# Patient Record
Sex: Female | Born: 1950
Health system: Southern US, Community
[De-identification: ages and names within clinical notes are randomized; demographics above are authoritative.]

## PROBLEM LIST (undated history)

## (undated) DIAGNOSIS — H409 Unspecified glaucoma: Secondary | ICD-10-CM

## (undated) DIAGNOSIS — I10 Essential (primary) hypertension: Secondary | ICD-10-CM

## (undated) DIAGNOSIS — E785 Hyperlipidemia, unspecified: Secondary | ICD-10-CM

## (undated) DIAGNOSIS — H269 Unspecified cataract: Secondary | ICD-10-CM

## (undated) DIAGNOSIS — E039 Hypothyroidism, unspecified: Secondary | ICD-10-CM

## (undated) DIAGNOSIS — U071 COVID-19: Secondary | ICD-10-CM

## (undated) DIAGNOSIS — K219 Gastro-esophageal reflux disease without esophagitis: Secondary | ICD-10-CM

## (undated) DIAGNOSIS — E079 Disorder of thyroid, unspecified: Secondary | ICD-10-CM

## (undated) DIAGNOSIS — M1990 Unspecified osteoarthritis, unspecified site: Secondary | ICD-10-CM

## (undated) HISTORY — DX: Essential (primary) hypertension: I10

## (undated) HISTORY — DX: Hypothyroidism, unspecified: E03.9

## (undated) HISTORY — DX: COVID-19: U07.1

## (undated) HISTORY — DX: Unspecified osteoarthritis, unspecified site: M19.90

## (undated) HISTORY — DX: Unspecified cataract: H26.9

## (undated) HISTORY — DX: Hyperlipidemia, unspecified: E78.5

## (undated) HISTORY — DX: Gastro-esophageal reflux disease without esophagitis: K21.9

## (undated) HISTORY — DX: Disorder of thyroid, unspecified: E07.9

## (undated) HISTORY — DX: Unspecified glaucoma: H40.9

---

## 2000-06-07 ENCOUNTER — Other Ambulatory Visit: Admission: RE | Admit: 2000-06-07 | Discharge: 2000-06-07 | Payer: Self-pay | Admitting: Obstetrics and Gynecology

## 2001-06-22 ENCOUNTER — Other Ambulatory Visit: Admission: RE | Admit: 2001-06-22 | Discharge: 2001-06-22 | Payer: Self-pay | Admitting: Obstetrics and Gynecology

## 2002-06-19 ENCOUNTER — Other Ambulatory Visit: Admission: RE | Admit: 2002-06-19 | Discharge: 2002-06-19 | Payer: Self-pay | Admitting: Obstetrics and Gynecology

## 2003-06-06 ENCOUNTER — Other Ambulatory Visit: Admission: RE | Admit: 2003-06-06 | Discharge: 2003-06-06 | Payer: Self-pay | Admitting: Obstetrics and Gynecology

## 2004-06-17 ENCOUNTER — Other Ambulatory Visit: Admission: RE | Admit: 2004-06-17 | Discharge: 2004-06-17 | Payer: Self-pay | Admitting: Obstetrics and Gynecology

## 2005-09-02 ENCOUNTER — Other Ambulatory Visit: Admission: RE | Admit: 2005-09-02 | Discharge: 2005-09-02 | Payer: Self-pay | Admitting: Obstetrics and Gynecology

## 2008-01-13 ENCOUNTER — Encounter (INDEPENDENT_AMBULATORY_CARE_PROVIDER_SITE_OTHER): Payer: Self-pay | Admitting: Gastroenterology

## 2008-01-13 ENCOUNTER — Ambulatory Visit (HOSPITAL_COMMUNITY): Admission: RE | Admit: 2008-01-13 | Discharge: 2008-01-13 | Payer: Self-pay | Admitting: Gastroenterology

## 2008-12-12 ENCOUNTER — Encounter: Payer: Self-pay | Admitting: Family Medicine

## 2008-12-12 ENCOUNTER — Encounter (INDEPENDENT_AMBULATORY_CARE_PROVIDER_SITE_OTHER): Payer: Self-pay | Admitting: *Deleted

## 2008-12-12 LAB — CONVERTED CEMR LAB
ALT: 22 units/L
AST: 14 units/L
Albumin: 4.2 g/dL
Alkaline Phosphatase: 57 units/L
Chloride, Serum: 103 mmol/L
LDL Cholesterol: 145 mg/dL
Potassium, serum: 3.7 mmol/L
Sodium, serum: 141 mmol/L
Total Bilirubin: 0.5 mg/dL
Total Protein: 7.4 g/dL
Vit D, 25-Hydroxy: 17 ng/mL

## 2009-01-25 ENCOUNTER — Ambulatory Visit: Payer: Self-pay | Admitting: Family Medicine

## 2009-01-25 DIAGNOSIS — E039 Hypothyroidism, unspecified: Secondary | ICD-10-CM

## 2009-01-25 DIAGNOSIS — I10 Essential (primary) hypertension: Secondary | ICD-10-CM | POA: Insufficient documentation

## 2009-01-25 DIAGNOSIS — E559 Vitamin D deficiency, unspecified: Secondary | ICD-10-CM

## 2009-01-25 DIAGNOSIS — H409 Unspecified glaucoma: Secondary | ICD-10-CM | POA: Insufficient documentation

## 2009-01-30 ENCOUNTER — Encounter (INDEPENDENT_AMBULATORY_CARE_PROVIDER_SITE_OTHER): Payer: Self-pay | Admitting: *Deleted

## 2009-02-01 ENCOUNTER — Ambulatory Visit: Payer: Self-pay | Admitting: Family Medicine

## 2009-02-01 DIAGNOSIS — M549 Dorsalgia, unspecified: Secondary | ICD-10-CM | POA: Insufficient documentation

## 2009-02-22 ENCOUNTER — Ambulatory Visit: Payer: Self-pay | Admitting: Family Medicine

## 2009-02-25 ENCOUNTER — Encounter (INDEPENDENT_AMBULATORY_CARE_PROVIDER_SITE_OTHER): Payer: Self-pay | Admitting: *Deleted

## 2009-02-25 LAB — CONVERTED CEMR LAB
CO2: 34 meq/L — ABNORMAL HIGH (ref 19–32)
Calcium: 9.2 mg/dL (ref 8.4–10.5)
Glucose, Bld: 85 mg/dL (ref 70–99)
Potassium: 3.6 meq/L (ref 3.5–5.1)
Sodium: 143 meq/L (ref 135–145)

## 2009-03-22 ENCOUNTER — Ambulatory Visit: Payer: Self-pay | Admitting: Family Medicine

## 2009-06-21 ENCOUNTER — Ambulatory Visit: Payer: Self-pay | Admitting: Family Medicine

## 2009-07-24 ENCOUNTER — Ambulatory Visit: Payer: Self-pay | Admitting: Family Medicine

## 2010-04-23 ENCOUNTER — Telehealth (INDEPENDENT_AMBULATORY_CARE_PROVIDER_SITE_OTHER): Payer: Self-pay | Admitting: *Deleted

## 2011-01-09 ENCOUNTER — Telehealth (INDEPENDENT_AMBULATORY_CARE_PROVIDER_SITE_OTHER): Payer: Self-pay | Admitting: *Deleted

## 2011-01-13 ENCOUNTER — Encounter (INDEPENDENT_AMBULATORY_CARE_PROVIDER_SITE_OTHER): Payer: Self-pay | Admitting: *Deleted

## 2011-01-13 ENCOUNTER — Encounter: Payer: Self-pay | Admitting: Family Medicine

## 2011-01-13 LAB — CONVERTED CEMR LAB
ALT: 27 units/L
AST: 20 units/L
Albumin: 4.3 g/dL
Alkaline Phosphatase: 55 units/L
BUN: 14 mg/dL
Calcium: 9.6 mg/dL
Chloride, Serum: 99 mmol/L
Cholesterol: 213 mg/dL
Free T4: 1.05 ng/dL
Potassium, serum: 3.2 mmol/L
Sodium, serum: 140 mmol/L
Vit D, 25-Hydroxy: 41 ng/mL

## 2011-01-13 NOTE — Progress Notes (Signed)
Summary: refill  Phone Note Refill Request Message from:  Fax from Pharmacy on Apr 23, 2010 4:57 PM  Refills Requested: Medication #1:  AMLODIPINE BESYLATE 5 MG  TABS 1 by mouth every day. fax from [facility] - [phone]    Method Requested: Fax to Mail Away Pharmacy Next Appointment Scheduled: none Initial call taken by: Okey Regal [place],  Apr 23, 2010 4:57 PM  Follow-up for Phone Call        rx faxed to [phone] .Kandice Hams  Apr 24, 2010 9:00 AM  Follow-up by: Kandice Hams,  Apr 24, 2010 9:00 AM    Prescriptions: AMLODIPINE BESYLATE 5 MG  TABS (AMLODIPINE BESYLATE) 1 by mouth every day  #90 x 1   Entered by:   Kandice Hams   Authorized by:   Neena Rhymes MD   Signed by:   Kandice Hams on 04/24/2010   Method used:   Printed then faxed to ...       [facility] (mail-order)       [address] [place], [place]  [address]       Ph: [phone]       Fax: [phone]   RxID:   [id number]

## 2011-01-19 ENCOUNTER — Encounter: Payer: Self-pay | Admitting: Family Medicine

## 2011-01-19 LAB — CONVERTED CEMR LAB: Pap Smear: NORMAL

## 2011-01-20 ENCOUNTER — Encounter (INDEPENDENT_AMBULATORY_CARE_PROVIDER_SITE_OTHER): Payer: Self-pay | Admitting: *Deleted

## 2011-01-20 ENCOUNTER — Telehealth (INDEPENDENT_AMBULATORY_CARE_PROVIDER_SITE_OTHER): Payer: Self-pay | Admitting: *Deleted

## 2011-01-21 NOTE — Progress Notes (Signed)
Summary: --please schedule--pt has questions  Phone Note Refill Request Message from:  Fax from Pharmacy on January 09, 2011 8:42 AM  Refills Requested: Medication #1:  AMLODIPINE BESYLATE 5 MG  TABS 1 by mouth every day. Jocelyn Lamer - fax [phone]  Initial call taken by: Okey Regal Spring,  January 09, 2011 8:43 AM  Follow-up for Phone Call        please call and schedule appt patient hasn't been seen since 07/24/2009 will not refill meds until office visit schedule and will only do 30 day supply ....Marland KitchenMarland KitchenDoristine Devoid CMA  January 09, 2011 1:44 PM   Additional Follow-up for Phone Call Additional follow up Details #1::        lmtcb Additional Follow-up by: Lavell Islam,  January 09, 2011 2:02 PM    Additional Follow-up for Phone Call Additional follow up Details #2::    Patient says she saw Dr Milton Ferguson the day after [facility] left message, so Dr Milton Ferguson refilled her prescription--  I suggested that she might want to schedule a physical since it had been over a year, but she said that Dr Milton Ferguson did a complete physical on that day too, so patient feels like she doesnt need to see Dr Beverely Low for a physical   Follow-up by: Jerolyn Shin,  January 15, 2011 10:33 AM  Additional Follow-up for Phone Call Additional follow up Details #3:: Details for Additional Follow-up Action Taken: Patient called back and asked if Dr Beverely Low could call her back to explain when she should be seen by Dr Beverely Low since Dr Milton Ferguson sees her for ob/gyn problems and checkup as well as blood pressure, etc--please call her at [phone]---I asked if the nurse could call her back and she said that would be OK Additional Follow-up by: Jerolyn Shin,  January 15, 2011 10:47 AM  Pt established here and indicated that her BP had never really been controlled.  we switched her meds and got BP better.  people w/ HTN should f/u every 6 months- once to check BP, once for CPE.  GYN will do pap and breast exam but rarely do they check  eyes, ears, nose, throat, heart, lungs, skin, etc.  We have plenty of pt's that see their GYN once yearly but also have a physical and do regular f/u's w/ me for their medical issues (including BP).  If Dr Vincente Poli is comfortable coordinating all of pt's care then she does not see me but this is usually not the case.  Neena Rhymes MD  January 15, 2011 11:10 AM.   Left message to call office .Felecia Deloach CMA  January 15, 2011 12:35 PM   Discuss with patient.Felecia Deloach CMA  January 16, 2011 10:40 AM

## 2011-01-29 NOTE — Miscellaneous (Signed)
Summary: labs from gyn  Clinical Lists Changes  Observations: Added new observation of VIT D 25-OH: 41 ng/mL (01/13/2011 13:35) Added new observation of FT4 INDEX: 3.1  (01/13/2011 13:35) Added new observation of TSH: 3.3.08 microintl units/mL (01/13/2011 13:35) Added new observation of T4, FREE: 1.05 ng/dL (45/40/9811 91:47) Added new observation of TRIGLYC TOT: 73 mg/dL (82/95/6213 08:65) Added new observation of LDL: 134 mg/dL (78/46/9629 52:84) Added new observation of HDL: 64 mg/dL (13/24/4010 27:25) Added new observation of CHOLESTEROL: 213 mg/dL (36/64/4034 74:25) Added new observation of BILI TOTAL: 0.6 mg/dL (95/63/8756 43:32) Added new observation of ALK PHOS: 55 units/L (01/13/2011 13:35) Added new observation of SGPT (ALT): 27 units/L (01/13/2011 13:35) Added new observation of SGOT (AST): 20 units/L (01/13/2011 13:35) Added new observation of PROTEIN, TOT: 7.2 g/dL (95/18/8416 60:63) Added new observation of ALBUMIN: 4.3 g/dL (01/60/1093 23:55) Added new observation of CALCIUM: 9.6 mg/dL (73/22/0254 27:06) Added new observation of HGBA1C: 6.2 % (01/13/2011 13:35) Added new observation of GLUCOSE SER: 87 mg/dL (23/76/2831 51:76) Added new observation of CREATININE: 1.01 mg/dL (16/06/3709 62:69) Added new observation of BUN: 14 mg/dL (48/54/6270 35:00) Added new observation of CO2 TOTAL: 26 mmol/L (01/13/2011 13:35) Added new observation of CHLORIDE: 99 mmol/L (01/13/2011 13:35) Added new observation of POTASSIUM: 3.2 mmol/L (01/13/2011 13:35) Added new observation of SODIUM: 140 mmol/L (01/13/2011 13:35)  Appended Document: labs from gyn pt's LDL is elevated.  should have OV to discuss results and possibly starting medication (which is what i would recommend)  Appended Document: labs from gyn see phone note

## 2011-01-29 NOTE — Progress Notes (Signed)
Summary: labs  Phone Note Outgoing Call   Call placed by: Doristine Devoid CMA,  January 20, 2011 3:04 PM Call placed to: Patient Summary of Call: pt's LDL is elevated.  should have OV to discuss results and possibly starting medication (which is what i would recommend)  Follow-up for Phone Call        left message on machine .......Marland KitchenDoristine Devoid CMA  January 20, 2011 3:04 PM   spoke w/ patient aware of information office visit scheduled.....Marland KitchenMarland KitchenDoristine Devoid CMA  January 23, 2011 3:48 PM

## 2011-02-02 ENCOUNTER — Ambulatory Visit (INDEPENDENT_AMBULATORY_CARE_PROVIDER_SITE_OTHER): Payer: Managed Care, Other (non HMO) | Admitting: Family Medicine

## 2011-02-02 ENCOUNTER — Encounter: Payer: Self-pay | Admitting: Family Medicine

## 2011-02-02 DIAGNOSIS — E785 Hyperlipidemia, unspecified: Secondary | ICD-10-CM | POA: Insufficient documentation

## 2011-02-02 DIAGNOSIS — E119 Type 2 diabetes mellitus without complications: Secondary | ICD-10-CM | POA: Insufficient documentation

## 2011-02-10 NOTE — Assessment & Plan Note (Signed)
Summary: Discuss labs from gyn/cdj   Vital Signs:  Patient profile:   60 year old female Height:      62.50 inches Weight:      227 pounds BMI:     41.00 Pulse rate:   70 / minute BP sitting:   132 / 80  (left arm)  Vitals Entered By: Doristine Devoid CMA (February 02, 2011 10:57 AM) CC: f/u on labs    History of Present Illness: 60 yo woman here today to discuss labs from GYN.  recently started walking- has lost 3 lbs since GYN visit.  reviewed that A1C is elevated which would put her in the diabetic range.  has appt upcoming w/ endo.  discussed cholesterol and starting statin.  Current Medications (verified): 1)  Synthroid 50 Mcg Tabs (Levothyroxine Sodium) .... Take One Tablet Daily 2)  Travatan Z 0.004 % Soln (Travoprost) .... Use Daily 3)  Fluticasone Propionate 50 Mcg/act  Susp (Fluticasone Propionate) .... 2 Sprays Each Nostril Once Daily 4)  Omeprazole 20 Mg Tbec (Omeprazole) .... Take 1 Tab Once Daily 5)  Lisinopril-Hydrochlorothiazide 20-25 Mg  Tabs (Lisinopril-Hydrochlorothiazide) .... Take 1 Tab By Mouth Daily 6)  Aspirin 81 Mg Tbec (Aspirin) .... Take One Tablet Daily 7)  Amlodipine Besylate 5 Mg  Tabs (Amlodipine Besylate) .Marland Kitchen.. 1 By Mouth Every Day 8)  Vitamin D3 2000 Unit Caps (Cholecalciferol) .... Take One Tablet Daily 9)  Calcium 1500 Mg Tabs (Calcium Carbonate) .... Take One Tablet Daily 10)  Ra Biotin 2500 Mcg Caps (Biotin) .... Take One Tablet Daily  Allergies (verified): No Known Drug Allergies  Past History:  Past Medical History: Hypothyroidism Hypertension Glaucoma Vitamin D Deficiency DM hyperlipidemia  Review of Systems      See HPI  Physical Exam  General:  Obese, well-developed,well-nourished,in no acute distress; alert,appropriate and cooperative throughout examination Psych:  Cognition and judgment appear intact. Alert and cooperative with normal attention span and concentration. No apparent delusions, illusions,  hallucinations   Impression & Recommendations:  Problem # 1:  DIABETES MELLITUS TYPE 2 (ICD-250.00) Assessment New pt's A1C at GYN is elevated into the diabetic range.  at this stage does not require meds.  has appt w/ Endo scheduled. Her updated medication list for this problem includes:    Lisinopril-hydrochlorothiazide 20-25 Mg Tabs (Lisinopril-hydrochlorothiazide) .Marland Kitchen... Take 1 tab by mouth daily    Aspirin 81 Mg Tbec (Aspirin) .Marland Kitchen... Take one tablet daily  Problem # 2:  HYPERLIPIDEMIA (ICD-272.4) Assessment: New discussed that goal LDL w/ new dx of DM is <70.  pt wants to attempt to control w/ diet and exercise.  will follow and repeat labs in July.  if still elevated will start statin.  Complete Medication List: 1)  Synthroid 50 Mcg Tabs (Levothyroxine sodium) .... Take one tablet daily 2)  Travatan Z 0.004 % Soln (Travoprost) .... Use daily 3)  Fluticasone Propionate 50 Mcg/act Susp (Fluticasone propionate) .... 2 sprays each nostril once daily 4)  Omeprazole 20 Mg Tbec (Omeprazole) .... Take 1 tab once daily 5)  Lisinopril-hydrochlorothiazide 20-25 Mg Tabs (Lisinopril-hydrochlorothiazide) .... Take 1 tab by mouth daily 6)  Aspirin 81 Mg Tbec (Aspirin) .... Take one tablet daily 7)  Amlodipine Besylate 5 Mg Tabs (Amlodipine besylate) .Marland Kitchen.. 1 by mouth every day 8)  Vitamin D3 2000 Unit Caps (Cholecalciferol) .... Take one tablet daily 9)  Calcium 1500 Mg Tabs (Calcium carbonate) .... Take one tablet daily 10)  Ra Biotin 2500 Mcg Caps (Biotin) .... Take one tablet daily  Patient  Instructions: 1)  Follow up as scheduled for your complete physical in July 2)  Keep up the good work on diet and exercise 3)  Call with any questions or concerns! 4)  Hang in there!!   Orders Added: 1)  Est. Patient Level III [04540]     Preventive Care Screening  Mammogram:    Date:  01/19/2011    Results:  normal   Pap Smear:    Date:  01/19/2011    Results:  normal

## 2011-02-16 ENCOUNTER — Ambulatory Visit (INDEPENDENT_AMBULATORY_CARE_PROVIDER_SITE_OTHER): Payer: Managed Care, Other (non HMO) | Admitting: Family Medicine

## 2011-02-16 ENCOUNTER — Encounter: Payer: Self-pay | Admitting: Family Medicine

## 2011-02-16 DIAGNOSIS — R609 Edema, unspecified: Secondary | ICD-10-CM

## 2011-02-16 DIAGNOSIS — M79609 Pain in unspecified limb: Secondary | ICD-10-CM | POA: Insufficient documentation

## 2011-02-18 LAB — CONVERTED CEMR LAB: Rhuematoid fact SerPl-aCnc: <10 intl units/mL (ref <=14)

## 2011-02-24 NOTE — Assessment & Plan Note (Signed)
Summary: ankle swollen/cbs   Vital Signs:  Patient profile:   60 year old female Height:      62.50 inches (158.75 cm) Weight:      227.38 pounds (103.35 kg) BMI:     41.07 BP sitting:   124 / 80  (left arm) Cuff size:   large  Vitals Entered By: Lucious Groves CMA (February 16, 2011 1:27 PM) CC: Left ankle swollen./kb Is Patient Diabetic? Yes Pain Assessment Patient in pain? no      Comments Patient notes that she noticedit last night and is not sure if her leg is swollen or not. Patient notes that she has not injured herself and it is not painful.   History of Present Illness: 60 yo woman here today w/ L ankle swelling.  noticed swelling yesterday.  no hx of injury.  no pain.  no recent travel or immobility.  has increased amount of walking recently.  hand pain- bilateral, joints are lumpy and crooked.  increased pain- most noticeable along 1st MCP joints bilaterally and 3rd fingers bilaterally.  pain is not worse AM vs PM.  Current Medications (verified): 1)  Synthroid 50 Mcg Tabs (Levothyroxine Sodium) .... Take One Tablet Daily 2)  Travatan Z 0.004 % Soln (Travoprost) .... Use Daily 3)  Omeprazole 20 Mg Tbec (Omeprazole) .... Take 1 Tab Once Daily 4)  Lisinopril-Hydrochlorothiazide 20-25 Mg  Tabs (Lisinopril-Hydrochlorothiazide) .... Take 1 Tab By Mouth Daily 5)  Aspirin 81 Mg Tbec (Aspirin) .... Take One Tablet Daily 6)  Amlodipine Besylate 5 Mg  Tabs (Amlodipine Besylate) .Marland Kitchen.. 1 By Mouth Every Day 7)  Vitamin D3 2000 Unit Caps (Cholecalciferol) .... Take One Tablet Daily 8)  Calcium 1500 Mg Tabs (Calcium Carbonate) .... Take One Tablet Daily 9)  Ra Biotin 2500 Mcg Caps (Biotin) .... Take One Tablet Daily 10)  Fish Oil 1500 Mg .... Three Times A Day  Allergies (verified): No Known Drug Allergies  Review of Systems      See HPI  Physical Exam  General:  Obese, well-developed,well-nourished,in no acute distress; alert,appropriate and cooperative throughout  examination Msk:  mild swelling of L ankle bursa inferior to lateral malleolus no swelling of lower leg no pain w/ plantar/dorsiflexion, inversion/eversion of L ankle  ulnar deviation of DIP joints bilaterally w/ nodularity of DIPs. Pulses:  +2 DP/PT   Impression & Recommendations:  Problem # 1:  ANKLE EDEMA (ICD-782.3) Assessment New mild swelling of L bursa.  no pain.  no further w/u at this time.  elevate, ice, NSAIDs as needed.  reviewed supportive care and red flags that should prompt return.  Pt expresses understanding and is in agreement w/ this plan. Her updated medication list for this problem includes:    Lisinopril-hydrochlorothiazide 20-25 Mg Tabs (Lisinopril-hydrochlorothiazide) .Marland Kitchen... Take 1 tab by mouth daily  Problem # 2:  HAND PAIN, BILATERAL (ICD-729.5) Assessment: New concern for RA given the nodularity and ulnar deviation. check labs. Orders: Venipuncture (16109) T-Antinuclear Antib (ANA) [phone]) T-Rheumatoid Factor ([phone])  Complete Medication List: 1)  Synthroid 50 Mcg Tabs (Levothyroxine sodium) .... Take one tablet daily 2)  Travatan Z 0.004 % Soln (Travoprost) .... Use daily 3)  Omeprazole 20 Mg Tbec (Omeprazole) .... Take 1 tab once daily 4)  Lisinopril-hydrochlorothiazide 20-25 Mg Tabs (Lisinopril-hydrochlorothiazide) .... Take 1 tab by mouth daily 5)  Aspirin 81 Mg Tbec (Aspirin) .... Take one tablet daily 6)  Amlodipine Besylate 5 Mg Tabs (Amlodipine besylate) .Marland Kitchen.. 1 by mouth every day 7)  Vitamin D3  2000 Unit Caps (Cholecalciferol) .... Take one tablet daily 8)  Calcium 1500 Mg Tabs (Calcium carbonate) .... Take one tablet daily 9)  Ra Biotin 2500 Mcg Caps (Biotin) .... Take one tablet daily 10)  Fish Oil 1500 Mg  .... Three times a day  Patient Instructions: 1)  We'll notify you of your lab results 2)  Elevate your legs at the end of the day 3)  Ice as needed for pain/swelling 4)  Tylenol or ibuprofen for hand or ankle pain 5)  Call  with any questions or concerns 6)  Hang in there!!!   Orders Added: 1)  Venipuncture [36415] 2)  T-Antinuclear Antib (ANA) [[id number]] 3)  T-Rheumatoid Factor [[id number]] 4)  Est. Patient Level III [30865]

## 2011-04-28 NOTE — Op Note (Signed)
NAME:  Priscilla Thompson, Priscilla Thompson NO.:  [id number]   MEDICAL RECORD NO.:  [id number]          PATIENT TYPE:  AMB   LOCATION:  ENDO                         FACILITY:  [facility]   PHYSICIAN:  Anselmo Rod, M.D.  DATE OF BIRTH:  1951/10/18   DATE OF PROCEDURE:  01/13/2008  DATE OF DISCHARGE:                               OPERATIVE REPORT   PROCEDURE PERFORMED:  Colonoscopy with multiple cold biopsies.   ENDOSCOPIST:  Anselmo Rod, MD.   INSTRUMENT USED:  Pentax video colonoscope.   INDICATIONS FOR PROCEDURE:  A 60 year old, African-American female  undergoing a screening colonoscopy to rule out colonic polyps, masses,  etc.   PREPROCEDURE PREPARATION:  Informed consent was procured from the  patient.  The patient fasted for 4 hours prior to the procedure and  prepped with 20 OsmoPrep pills the night of and 12 OsmoPrep pills the  morning of the procedure. The risks and benefits of the procedure  including a 10% miss rate of cancer and polyp were discussed with the  patient as well.   PREPROCEDURE PHYSICAL:  The patient had stable vital signs. Neck supple.  Chest clear to auscultation.  S1, S2 regular.  Abdomen soft with normal  bowel sounds.   DESCRIPTION OF PROCEDURE:  The patient was placed in the left lateral  decubitus position and sedated with Fentanyl and Versed.  Once the  patient was adequately sedate and maintained on low-flow oxygen and  continuous cardiac monitoring, the Pentax video colonoscope was advanced  from the rectum to the cecum.  The appendiceal orifice and ileocecal  valve were visualized and photographed.  The terminal ileum was  intubated for a short distance and visualized closely. A small sessile  polyp was biopsied from the distal TI. This may represent lymphoid a  follicle but the reason the biopsy was done was to rule out a carcinoid  lesion. Two small sessile polyps were biopsied from the proximal right  colon. There was isolated  diverticulum in the left colon. Retroflexion  in the rectum revealed no abnormalities.  The patient tolerated the  procedure well without immediate complications.   IMPRESSION:  1. Isolated diverticulum in the left colon .  2. Two small sessile polyps biopsied from the proximal right colon.  3. Small sessile polyp biopsied in the distal terminal ileum.  4. Small isolated diverticulum noted in the distal small bowel.   RECOMMENDATIONS:  1. Await pathology results.  2. Avoid all nonsteroidals including aspirin for the next 2 weeks.  3. Repeat colonoscopy depending on pathology results.  4. Continue a high fiber diet with liberal fluid intake.      Anselmo Rod, M.D.  Electronically Signed     JNM/MEDQ  D:  01/13/2008  T:  01/13/2008  Job:  [id number]   cc:   Marcelino Duster L. Vincente Poli, M.D.  Fax: [phone]

## 2011-05-26 ENCOUNTER — Encounter: Payer: Managed Care, Other (non HMO) | Attending: Endocrinology | Admitting: Dietician

## 2011-05-26 DIAGNOSIS — E119 Type 2 diabetes mellitus without complications: Secondary | ICD-10-CM | POA: Insufficient documentation

## 2011-05-26 DIAGNOSIS — Z713 Dietary counseling and surveillance: Secondary | ICD-10-CM | POA: Insufficient documentation

## 2011-06-30 ENCOUNTER — Encounter: Payer: Self-pay | Admitting: Family Medicine

## 2011-08-18 ENCOUNTER — Ambulatory Visit (INDEPENDENT_AMBULATORY_CARE_PROVIDER_SITE_OTHER): Payer: Managed Care, Other (non HMO) | Admitting: Family Medicine

## 2011-08-18 ENCOUNTER — Encounter: Payer: Self-pay | Admitting: Family Medicine

## 2011-08-18 DIAGNOSIS — E785 Hyperlipidemia, unspecified: Secondary | ICD-10-CM

## 2011-08-18 DIAGNOSIS — I10 Essential (primary) hypertension: Secondary | ICD-10-CM

## 2011-08-18 DIAGNOSIS — Z0000 Encounter for general adult medical examination without abnormal findings: Secondary | ICD-10-CM | POA: Insufficient documentation

## 2011-08-18 LAB — LIPID PANEL
Cholesterol: 206 mg/dL — ABNORMAL HIGH (ref 0–200)
HDL: 66 mg/dL (ref >39.00)
VLDL: 8.2 mg/dL (ref 0.0–40.0)

## 2011-08-18 LAB — BASIC METABOLIC PANEL
BUN: 16 mg/dL (ref 6–23)
CO2: 29 mEq/L (ref 19–32)
Calcium: 9 mg/dL (ref 8.4–10.5)
GFR: 71.67 mL/min (ref >60.00)
Glucose, Bld: 98 mg/dL (ref 70–99)
Sodium: 141 mEq/L (ref 135–145)

## 2011-08-18 LAB — HEPATIC FUNCTION PANEL
Albumin: 3.9 g/dL (ref 3.5–5.2)
Alkaline Phosphatase: 47 U/L (ref 39–117)
Total Protein: 7.4 g/dL (ref 6.0–8.3)

## 2011-08-18 NOTE — Patient Instructions (Signed)
Follow up in 1 year or as needed We'll notify you of your lab results and make any changes as needed You look great!  Keep up the good work! Call with any questions or concerns Enjoy your fall!!!

## 2011-08-18 NOTE — Progress Notes (Signed)
  Subjective:    Patient ID: Priscilla Thompson, female    DOB: 06/03/1951, 60 y.o.   MRN: [id number]  HPI CPE- UTD on GYN ([facility]al), Endo is Dr Talmage Nap.  Due for colonoscopy next year.  No concerns today.  Review of Systems Patient reports no vision/ hearing changes, adenopathy,fever, weight change,  persistant/recurrent hoarseness , swallowing issues, chest pain, palpitations, edema, persistant/recurrent cough, hemoptysis, dyspnea (rest/exertional/paroxysmal nocturnal), gastrointestinal bleeding (melena, rectal bleeding), abdominal pain, significant heartburn, bowel changes, GU symptoms (dysuria, hematuria, incontinence), Gyn symptoms (abnormal  bleeding, pain),  syncope, focal weakness, memory loss, numbness & tingling, skin/hair/nail changes, abnormal bruising or bleeding, anxiety, or depression.     Objective:   Physical Exam  General Appearance:    Alert, cooperative, no distress, appears stated age  Head:    Normocephalic, without obvious abnormality, atraumatic  Eyes:    PERRL, conjunctiva/corneas clear, EOM's intact, fundi    benign, both eyes  Ears:    Normal TM's and external ear canals, both ears  Nose:   Nares normal, septum midline, mucosa normal, no drainage    or sinus tenderness  Throat:   Lips, mucosa, and tongue normal; teeth and gums normal  Neck:   Supple, symmetrical, trachea midline, no adenopathy;    Thyroid: no enlargement/tenderness/nodules  Back:     Symmetric, no curvature, ROM normal, no CVA tenderness  Lungs:     Clear to auscultation bilaterally, respirations unlabored  Chest Wall:    No tenderness or deformity   Heart:    Regular rate and rhythm, S1 and S2 normal, no murmur, rub   or gallop  Breast Exam:    Deferred to GYN  Abdomen:     Soft, non-tender, bowel sounds active all four quadrants,    no masses, no organomegaly  Genitalia:    Deferred to GYN  Rectal:    Extremities:   Extremities normal, atraumatic, no cyanosis or edema  Pulses:   2+ and  symmetric all extremities  Skin:   Skin color, texture, turgor normal, no rashes or lesions  Lymph nodes:   Cervical, supraclavicular, and axillary nodes normal  Neurologic:   CNII-XII intact, normal strength, sensation and reflexes    throughout          Assessment & Plan:

## 2011-08-18 NOTE — Assessment & Plan Note (Signed)
Pt's PE WNL.  UTD on health screening.  Pt recently had labs done at Endo but doesn't remember cholesterol being checked.  Anticipatory guidance provided.

## 2011-08-18 NOTE — Assessment & Plan Note (Signed)
Check labs and start meds prn.  Pt's LDL goal is <70 due to dx of DM.

## 2011-08-18 NOTE — Assessment & Plan Note (Signed)
Well controlled today.  Asymptomatic.  No changes. 

## 2011-08-19 ENCOUNTER — Telehealth: Payer: Self-pay

## 2011-08-19 MED ORDER — ATORVASTATIN CALCIUM 20 MG PO TABS: 20.0000 mg | ORAL_TABLET | Freq: Every day | ORAL | Status: DC

## 2011-08-19 NOTE — Telephone Encounter (Signed)
Message copied by Beverely Low on Wed Aug 19, 2011  1:42 PM ------      Message from: Sheliah Hatch      Created: Tue Aug 18, 2011  1:49 PM       Due to pt's recent dx of diabetes her LDL goal is <70.  Based on this goal, she needs to start Lipitor 20mg  nightly and recheck LFTs in 6-8 weeks.            Remainder of labs look great!

## 2011-08-19 NOTE — Telephone Encounter (Signed)
Pt aware and labs mailed 

## 2011-10-14 ENCOUNTER — Ambulatory Visit (INDEPENDENT_AMBULATORY_CARE_PROVIDER_SITE_OTHER): Payer: Managed Care, Other (non HMO) | Admitting: Family Medicine

## 2011-10-14 ENCOUNTER — Encounter: Payer: Self-pay | Admitting: Family Medicine

## 2011-10-14 DIAGNOSIS — E119 Type 2 diabetes mellitus without complications: Secondary | ICD-10-CM

## 2011-10-14 DIAGNOSIS — E785 Hyperlipidemia, unspecified: Secondary | ICD-10-CM

## 2011-10-14 DIAGNOSIS — E039 Hypothyroidism, unspecified: Secondary | ICD-10-CM

## 2011-10-14 NOTE — Patient Instructions (Signed)
Follow up in 4 months to recheck diabetes and cholesterol We'll notify you of your lab results and make any changes if needed Keep up the good work on healthy food choices and regular activity Call with any questions or concerns Happy Holidays!!

## 2011-10-14 NOTE — Progress Notes (Signed)
  Subjective:    Patient ID: Priscilla Thompson, female    DOB: Apr 16, 1951, 60 y.o.   MRN: [id number]  HPI Hyperlipidemia- new problem for pt, started on Lipitor at last visit.  Is now exercising and doing stairs.  No abd pain, N/V, myalgias.  DM- dx'd 2/12.  Has been seeing Dr Talmage Nap.  Not on meds.  Prefers to have [place] follow this.  Not checking sugars.  Denies CP, SOB, HAs, visual changes, edema.  Is maintaining on diet and exercise.  Hypothyroid- chronic problem, maintained on synthroid.  Asymptomatic.  Review of Systems For ROS see HPI     Objective:   Physical Exam  Vitals reviewed. Constitutional: She is oriented to person, place, and time. She appears well-developed and well-nourished. No distress.  HENT:  Head: Normocephalic and atraumatic.  Eyes: Conjunctivae and EOM are normal. Pupils are equal, round, and reactive to light.  Neck: Normal range of motion. Neck supple. No thyromegaly present.  Cardiovascular: Normal rate, regular rhythm, normal heart sounds and intact distal pulses.   No murmur heard. Pulmonary/Chest: Effort normal and breath sounds normal. No respiratory distress.  Abdominal: Soft. She exhibits no distension. There is no tenderness.  Musculoskeletal: She exhibits no edema.  Lymphadenopathy:    She has no cervical adenopathy.  Neurological: She is alert and oriented to person, place, and time.  Skin: Skin is warm and dry.  Psychiatric: She has a normal mood and affect. Her behavior is normal.          Assessment & Plan:

## 2011-10-15 LAB — BASIC METABOLIC PANEL
BUN: 17 mg/dL (ref 6–23)
CO2: 26 mEq/L (ref 19–32)
Calcium: 9.4 mg/dL (ref 8.4–10.5)
Glucose, Bld: 77 mg/dL (ref 70–99)
Potassium: 3.4 mEq/L — ABNORMAL LOW (ref 3.5–5.1)
Sodium: 140 mEq/L (ref 135–145)

## 2011-10-15 LAB — HEPATIC FUNCTION PANEL
ALT: 26 U/L (ref 0–35)
Total Bilirubin: 0.8 mg/dL (ref 0.3–1.2)
Total Protein: 7.5 g/dL (ref 6.0–8.3)

## 2011-10-15 LAB — HEMOGLOBIN A1C: Hgb A1c MFr Bld: 6.4 % (ref 4.6–6.5)

## 2011-10-18 NOTE — Assessment & Plan Note (Signed)
Has been seeing Dr Talmage Nap but prefers that I assume care.  Will get A1C and adjust meds prn.  UTD on eye exam.  Reviewed importance of ADA diet and regular exercise.  Pt expressed understanding and is in agreement w/ plan.

## 2011-10-18 NOTE — Assessment & Plan Note (Signed)
Pt was started on statin at last visit.  Tolerating w/out difficulty.  Check LFTs.

## 2011-10-18 NOTE — Assessment & Plan Note (Signed)
Chronic problem.  Has been seeing Dr Talmage Nap.  Prefers that I assume care of this.  Check labs. Adjust meds prn.

## 2011-10-19 ENCOUNTER — Encounter: Payer: Self-pay | Admitting: *Deleted

## 2011-10-23 ENCOUNTER — Telehealth: Payer: Self-pay | Admitting: Family Medicine

## 2011-10-23 NOTE — Telephone Encounter (Signed)
Last OV 10-14-11 unable to locate last refill

## 2011-10-23 NOTE — Telephone Encounter (Signed)
.  left message to have patient return my call.  

## 2011-10-23 NOTE — Telephone Encounter (Signed)
Patient wants to know if she should continue taking atorvastatin & if so wants rx for 90 day supply [facility]

## 2011-10-23 NOTE — Telephone Encounter (Signed)
Yes- she needs to continue this medicine.  Ok for #90, 3 refills.

## 2011-11-03 MED ORDER — ATORVASTATIN CALCIUM 20 MG PO TABS: 20.0000 mg | ORAL_TABLET | Freq: Every day | ORAL | Status: DC

## 2011-11-03 NOTE — Telephone Encounter (Signed)
Addended by: Derry Lory A on: 11/03/2011 11:02 AM   Modules accepted: Orders

## 2011-11-03 NOTE — Telephone Encounter (Signed)
Spoke to pt to advise results/instructions. Pt understood. Sent rx for medication to [facility] for 90 with 3 refills.

## 2012-01-27 ENCOUNTER — Encounter: Payer: Self-pay | Admitting: Family Medicine

## 2012-01-27 ENCOUNTER — Ambulatory Visit (INDEPENDENT_AMBULATORY_CARE_PROVIDER_SITE_OTHER): Payer: Managed Care, Other (non HMO) | Admitting: Family Medicine

## 2012-01-27 DIAGNOSIS — J310 Chronic rhinitis: Secondary | ICD-10-CM

## 2012-01-27 DIAGNOSIS — E785 Hyperlipidemia, unspecified: Secondary | ICD-10-CM

## 2012-01-27 DIAGNOSIS — I10 Essential (primary) hypertension: Secondary | ICD-10-CM

## 2012-01-27 DIAGNOSIS — E039 Hypothyroidism, unspecified: Secondary | ICD-10-CM

## 2012-01-27 DIAGNOSIS — E119 Type 2 diabetes mellitus without complications: Secondary | ICD-10-CM

## 2012-01-27 LAB — LIPID PANEL
HDL: 60 mg/dL (ref >39.00)
LDL Cholesterol: 58 mg/dL (ref 0–99)
VLDL: 6 mg/dL (ref 0.0–40.0)

## 2012-01-27 LAB — CBC WITH DIFFERENTIAL/PLATELET
Basophils Absolute: 0 10*3/uL (ref 0.0–0.1)
HCT: 39.8 % (ref 36.0–46.0)
Lymphs Abs: 3.3 10*3/uL (ref 0.7–4.0)
MCV: 85.6 fl (ref 78.0–100.0)
Monocytes Absolute: 0.5 10*3/uL (ref 0.1–1.0)
Monocytes Relative: 6.6 % (ref 3.0–12.0)
Neutrophils Relative %: 51.7 % (ref 43.0–77.0)
Platelets: 239 10*3/uL (ref 150.0–400.0)
RDW: 13.8 % (ref 11.5–14.6)

## 2012-01-27 LAB — BASIC METABOLIC PANEL
Calcium: 9.1 mg/dL (ref 8.4–10.5)
GFR: 89.76 mL/min (ref >60.00)
Sodium: 138 mEq/L (ref 135–145)

## 2012-01-27 LAB — HEPATIC FUNCTION PANEL
Bilirubin, Direct: 0.1 mg/dL (ref 0.0–0.3)
Total Bilirubin: 0.8 mg/dL (ref 0.3–1.2)

## 2012-01-27 LAB — HEMOGLOBIN A1C: Hgb A1c MFr Bld: 6.4 % (ref 4.6–6.5)

## 2012-01-27 LAB — TSH: TSH: 2.28 u[IU]/mL (ref 0.35–5.50)

## 2012-01-27 NOTE — Assessment & Plan Note (Signed)
Chronic problem.  Has been maintaining w/ diet and exercise.  Not currently on meds.  Check A1C- adjust tx plan prn.

## 2012-01-27 NOTE — Assessment & Plan Note (Signed)
Chronic problem.  Well controlled.  Asymptomatic.  No changes. 

## 2012-01-27 NOTE — Assessment & Plan Note (Signed)
Check labs.  Adjust meds prn  

## 2012-01-27 NOTE — Assessment & Plan Note (Signed)
Pt's sxs are most likely due to untreated nasal allergies.  Start Claritin or Zyrtec daily.  Pt expressed understanding and is in agreement w/ plan.

## 2012-01-27 NOTE — Patient Instructions (Signed)
Follow up in 3 months to recheck sugar- you can eat before this We'll notify you of your lab results and fax them to Dr Vincente Poli Keep up the good work on diet and exercise Add Claritin or Zyrtec daily for seasonal allergies Call with any questions or concerns Happy Valentine's Day!!

## 2012-01-27 NOTE — Progress Notes (Signed)
  Subjective:    Patient ID: Priscilla Thompson, female    DOB: 03/10/1951, 61 y.o.   MRN: [id number]  HPI DM- chronic problem, has been maintaining w/ diet and exercise.  Not currently on meds.  Checking CBGs sporadically, running 90-110s.  No symptomatic lows- denies shaky or dizzy feeling.  Denies CP, SOB, HAs, visual changes.  UTD on eye exam.  Admits to poor holiday eating.  HTN- chronic problem, well controlled today.  On Lisinopril HCTZ and norvasc.  Asymptomatic.  Hyperlipidemia- chronic problem, on Lipitor.  Due for labs.  Denies abd pain, N/V, myalgias.  Head cold- reports sxs started last week w/ sore throat.  Now w/ copious clear nasal congestion.  Minimal cough.  Denies sinus pain or ear pain.   Review of Systems For ROS see HPI     Objective:   Physical Exam  Vitals reviewed. Constitutional: She is oriented to person, place, and time. She appears well-developed and well-nourished. No distress.  HENT:  Head: Normocephalic and atraumatic.  Right Ear: Tympanic membrane normal.  Left Ear: Tympanic membrane normal.  Nose: Mucosal edema and rhinorrhea present. Right sinus exhibits no maxillary sinus tenderness and no frontal sinus tenderness. Left sinus exhibits no maxillary sinus tenderness and no frontal sinus tenderness.  Mouth/Throat: Mucous membranes are normal. Posterior oropharyngeal erythema (w/ PND) present.  Eyes: Conjunctivae and EOM are normal. Pupils are equal, round, and reactive to light.  Neck: Normal range of motion. Neck supple. No thyromegaly present.  Cardiovascular: Normal rate, regular rhythm, normal heart sounds and intact distal pulses.   No murmur heard. Pulmonary/Chest: Effort normal and breath sounds normal. No respiratory distress. She has no wheezes. She has no rales.  Abdominal: Soft. She exhibits no distension. There is no tenderness.  Musculoskeletal: She exhibits no edema.  Lymphadenopathy:    She has no cervical adenopathy.  Neurological: She  is alert and oriented to person, place, and time.  Skin: Skin is warm and dry.  Psychiatric: She has a normal mood and affect. Her behavior is normal.          Assessment & Plan:

## 2012-01-27 NOTE — Assessment & Plan Note (Signed)
Chronic problem.  Tolerating statin w/out difficulty.  Check labs.  Adjust meds prn  

## 2012-01-29 ENCOUNTER — Other Ambulatory Visit: Payer: Self-pay | Admitting: Obstetrics and Gynecology

## 2012-02-08 ENCOUNTER — Encounter: Payer: Self-pay | Admitting: *Deleted

## 2012-02-08 MED ORDER — POTASSIUM CHLORIDE CRYS ER 20 MEQ PO TBCR: 20.0000 meq | EXTENDED_RELEASE_TABLET | Freq: Every day | ORAL | Status: DC

## 2012-02-08 NOTE — Progress Notes (Signed)
Addended by: Derry Lory A on: 02/08/2012 10:54 AM   Modules accepted: Orders

## 2012-03-23 ENCOUNTER — Encounter: Payer: Self-pay | Admitting: Family Medicine

## 2012-03-23 ENCOUNTER — Ambulatory Visit (INDEPENDENT_AMBULATORY_CARE_PROVIDER_SITE_OTHER): Payer: Managed Care, Other (non HMO) | Admitting: Family Medicine

## 2012-03-23 VITALS — BP 125/82 | HR 87 | Temp 98.5°F | Ht 62.75 in | Wt 209.0 lb

## 2012-03-23 DIAGNOSIS — J069 Acute upper respiratory infection, unspecified: Secondary | ICD-10-CM | POA: Insufficient documentation

## 2012-03-23 NOTE — Progress Notes (Signed)
  Subjective:    Patient ID: Priscilla Thompson, female    DOB: [birth year], 61 y.o.   MRN: [id number]  HPI URI- sxs started 1 week ago.  + nasal congestion, chills, anorexia, wet cough- intermittently productive of yellow sputum.  Was in bed x3 days.  sxs started suddenly.  No body aches.  Loose stools started yesterday.  Denies facial pain, ear pain.  + sick contacts.   Review of Systems For ROS see HPI     Objective:   Physical Exam  Constitutional: She appears well-developed and well-nourished. No distress.  HENT:  Head: Normocephalic and atraumatic.       TMs normal bilaterally Mild nasal congestion Throat w/out erythema, edema, or exudate  Eyes: Conjunctivae and EOM are normal. Pupils are equal, round, and reactive to light.  Neck: Normal range of motion. Neck supple.  Cardiovascular: Normal rate, regular rhythm, normal heart sounds and intact distal pulses.   No murmur heard. Pulmonary/Chest: Effort normal and breath sounds normal. No respiratory distress. She has no wheezes.       No cough heard  Lymphadenopathy:    She has no cervical adenopathy.          Assessment & Plan:

## 2012-03-23 NOTE — Patient Instructions (Signed)
This is a viral illness and seems to be going around Drink plenty of fluids REST! Delsym or Robitussin as needed for cough Call with any questions or concerns Hang in there!!!

## 2012-03-23 NOTE — Assessment & Plan Note (Signed)
Pt's sxs consistent w/ viral illness.  Appears to be resolving.  Reviewed supportive care and red flags that should prompt return.  Pt expressed understanding and is in agreement w/ plan.

## 2012-04-25 ENCOUNTER — Other Ambulatory Visit (INDEPENDENT_AMBULATORY_CARE_PROVIDER_SITE_OTHER): Payer: Managed Care, Other (non HMO)

## 2012-04-25 DIAGNOSIS — E119 Type 2 diabetes mellitus without complications: Secondary | ICD-10-CM

## 2012-04-25 LAB — HEMOGLOBIN A1C: Hgb A1c MFr Bld: 6.4 % (ref 4.6–6.5)

## 2012-04-25 NOTE — Progress Notes (Signed)
Labs only

## 2012-04-26 ENCOUNTER — Encounter: Payer: Self-pay | Admitting: *Deleted

## 2012-10-10 ENCOUNTER — Telehealth: Payer: Self-pay | Admitting: *Deleted

## 2012-10-10 NOTE — Telephone Encounter (Signed)
Pt called to advise she has forms to be completed for wellness and wanted to know when she needs to come in for labs, advised per MD Tabori protocol to come in every 3 months to re-check A1c and pt is overdue for CPE since 9-13 and last labs noted 5-13, MD Tabori advised she can come in for follow up OV with labs and at that OV she can schedule her CPE, pt understood and agreed to all instructions, accepted follow up apt for 10-12-12 at 2:45am MD Tabori made aware verbally

## 2012-10-12 ENCOUNTER — Encounter: Payer: Self-pay | Admitting: Family Medicine

## 2012-10-12 ENCOUNTER — Ambulatory Visit (INDEPENDENT_AMBULATORY_CARE_PROVIDER_SITE_OTHER): Payer: Managed Care, Other (non HMO) | Admitting: Family Medicine

## 2012-10-12 VITALS — BP 120/72 | HR 82 | Temp 98.3°F | Ht 62.75 in | Wt 220.2 lb

## 2012-10-12 DIAGNOSIS — E119 Type 2 diabetes mellitus without complications: Secondary | ICD-10-CM

## 2012-10-12 DIAGNOSIS — I10 Essential (primary) hypertension: Secondary | ICD-10-CM

## 2012-10-12 DIAGNOSIS — E785 Hyperlipidemia, unspecified: Secondary | ICD-10-CM

## 2012-10-12 NOTE — Progress Notes (Signed)
  Subjective:    Patient ID: Priscilla Thompson, female    DOB: 08-29-1951, 61 y.o.   MRN: [id number]  HPI DM- chronic problem, attempting to control w/ diet and exercise.  Has gained weight.  UTD on eye exam- Dr Lawerance Bach 03/25/12, Dr Dione Booze 07/13/12.  Denies numbness/tingling hands/feet.  HTN- chronic problem, well controlled on Norvasc, Lisinopril HCT.  Denies CP, SOB, HAs, visual changes, edema.  Hyperlipidemia- chronic problem, on Lipitor.  Denies abd pain, N/V, myalgias.   Review of Systems For ROS see HPI     Objective:   Physical Exam  Vitals reviewed. Constitutional: She is oriented to person, place, and time. She appears well-developed and well-nourished. No distress.  HENT:  Head: Normocephalic and atraumatic.  Eyes: Conjunctivae normal and EOM are normal. Pupils are equal, round, and reactive to light.  Neck: Normal range of motion. Neck supple. No thyromegaly present.  Cardiovascular: Normal rate, regular rhythm, normal heart sounds and intact distal pulses.   No murmur heard. Pulmonary/Chest: Effort normal and breath sounds normal. No respiratory distress.  Abdominal: Soft. She exhibits no distension. There is no tenderness.  Musculoskeletal: She exhibits no edema.  Lymphadenopathy:    She has no cervical adenopathy.  Neurological: She is alert and oriented to person, place, and time.  Skin: Skin is warm and dry.  Psychiatric: She has a normal mood and affect. Her behavior is normal.          Assessment & Plan:

## 2012-10-12 NOTE — Assessment & Plan Note (Signed)
Chronic problem.  Not currently on meds.  Pt has gained weight and admits to poor diet and limited exercise.  UTD on eye exam.  Asymptomatic.  Check labs- start meds prn.

## 2012-10-12 NOTE — Assessment & Plan Note (Signed)
Chronic problem.  Tolerating meds w/out difficulty.  Check labs.  Adjust meds prn  

## 2012-10-12 NOTE — Assessment & Plan Note (Signed)
Chronic problem.  Well controlled.  Asymptomatic.  No changes. 

## 2012-10-12 NOTE — Patient Instructions (Addendum)
Follow up in 3-4 months to recheck diabetes Schedule your complete physical in 6 months We'll notify you of your lab results and make any changes if needed Try and get regular exercise and make healthy food choices Call with any questions or concerns Happy Halloween!!

## 2012-10-13 ENCOUNTER — Encounter: Payer: Self-pay | Admitting: Family Medicine

## 2012-10-13 LAB — BASIC METABOLIC PANEL
Calcium: 9.6 mg/dL (ref 8.4–10.5)
Creatinine, Ser: 0.9 mg/dL (ref 0.4–1.2)

## 2012-10-13 LAB — LIPID PANEL: Cholesterol: 127 mg/dL (ref 0–200)

## 2012-10-13 LAB — HEMOGLOBIN A1C: Hgb A1c MFr Bld: 6.3 % (ref 4.6–6.5)

## 2012-10-13 LAB — HEPATIC FUNCTION PANEL
ALT: 26 U/L (ref 0–35)
Albumin: 3.7 g/dL (ref 3.5–5.2)
Total Protein: 7 g/dL (ref 6.0–8.3)

## 2012-10-14 ENCOUNTER — Encounter: Payer: Self-pay | Admitting: Family Medicine

## 2012-10-14 ENCOUNTER — Telehealth: Payer: Self-pay | Admitting: Family Medicine

## 2012-10-14 MED ORDER — ATORVASTATIN CALCIUM 20 MG PO TABS: 20.0000 mg | ORAL_TABLET | Freq: Every day | ORAL | Status: DC

## 2012-10-14 MED ORDER — POTASSIUM CHLORIDE CRYS ER 20 MEQ PO TBCR: 20.0000 meq | EXTENDED_RELEASE_TABLET | Freq: Every day | ORAL | Status: DC

## 2012-10-14 NOTE — Telephone Encounter (Signed)
Refill done.  

## 2012-10-14 NOTE — Telephone Encounter (Signed)
Refill: Atorvastatin tab 20 mg. Take 1 tablet daily. 90 day supply  Pot Chloride tab 20 meq er. Take 1 tablet daily. 90 day supply

## 2012-10-14 NOTE — Telephone Encounter (Signed)
The confirmation came through yesterday.

## 2012-10-19 ENCOUNTER — Encounter: Payer: Self-pay | Admitting: Family Medicine

## 2012-12-23 ENCOUNTER — Ambulatory Visit (INDEPENDENT_AMBULATORY_CARE_PROVIDER_SITE_OTHER): Payer: Managed Care, Other (non HMO)

## 2012-12-23 DIAGNOSIS — Z23 Encounter for immunization: Secondary | ICD-10-CM

## 2013-01-12 ENCOUNTER — Ambulatory Visit (INDEPENDENT_AMBULATORY_CARE_PROVIDER_SITE_OTHER): Payer: Managed Care, Other (non HMO) | Admitting: Family Medicine

## 2013-01-12 ENCOUNTER — Encounter: Payer: Self-pay | Admitting: Family Medicine

## 2013-01-12 VITALS — BP 140/80 | HR 93 | Temp 98.2°F | Ht 62.5 in | Wt 216.6 lb

## 2013-01-12 DIAGNOSIS — I10 Essential (primary) hypertension: Secondary | ICD-10-CM

## 2013-01-12 DIAGNOSIS — F4321 Adjustment disorder with depressed mood: Secondary | ICD-10-CM

## 2013-01-12 DIAGNOSIS — E119 Type 2 diabetes mellitus without complications: Secondary | ICD-10-CM

## 2013-01-12 NOTE — Patient Instructions (Addendum)
Follow up as scheduled for your complete physical We'll notify you of your lab results and make any changes if needed Keep up the good work!  You look great! Call with any questions or concerns I'm sorry for your loss Happy New Year!

## 2013-01-12 NOTE — Assessment & Plan Note (Signed)
New.  Lost brother unexpectedly on Christmas morning.  Pt obviously upset by this and having a hard time w/ the loss.  Has good family support.  Pt's current emotions well within reason- will continue to follow to monitor for excessive or prolonged sadness.

## 2013-01-12 NOTE — Assessment & Plan Note (Signed)
Chronic problem, asymptomatic.  UTD on eye exam.  Still not on meds.  Check labs.  Adjust meds prn

## 2013-01-12 NOTE — Progress Notes (Signed)
  Subjective:    Patient ID: Priscilla Thompson, female    DOB: Jun 20, 1951, 62 y.o.   MRN: [id number]  HPI DM- chronic problem, has been diet controlled, never on meds.  checking CBGs regularly, 100-110s.  Has lost 4 lbs since last visit.  UTD on eye exam.  No N/V/D, numbness/tingling.  HTN- chronic problem, on Lisinopril HCTZ, amlodipine.  No CP, SOB, HAs, visual changes.  Grief- brother died unexpectedly on Christmas morning.  Pt is obviously upset by this.  Pt states he died of COPD but had underlying lung and liver CA.   Review of Systems For ROS see HPI     Objective:   Physical Exam  Vitals reviewed. Constitutional: She is oriented to person, place, and time. She appears well-developed and well-nourished. No distress.  HENT:  Head: Normocephalic and atraumatic.  Eyes: Conjunctivae normal and EOM are normal. Pupils are equal, round, and reactive to light.  Neck: Normal range of motion. Neck supple. No thyromegaly present.  Cardiovascular: Normal rate, regular rhythm, normal heart sounds and intact distal pulses.   No murmur heard. Pulmonary/Chest: Effort normal and breath sounds normal. No respiratory distress.  Abdominal: Soft. She exhibits no distension. There is no tenderness.  Musculoskeletal: She exhibits no edema.  Lymphadenopathy:    She has no cervical adenopathy.  Neurological: She is alert and oriented to person, place, and time.  Skin: Skin is warm and dry.  Psychiatric: She has a normal mood and affect. Her behavior is normal.          Assessment & Plan:

## 2013-01-12 NOTE — Assessment & Plan Note (Signed)
Mildly elevated today compared to previous readings.  Pt feels this is due to her recent loss and continued grief.  Will continue to follow closely- no med changes at this time.

## 2013-01-13 LAB — BASIC METABOLIC PANEL
Calcium: 9.2 mg/dL (ref 8.4–10.5)
GFR: 76.56 mL/min (ref >60.00)
Sodium: 137 mEq/L (ref 135–145)

## 2013-01-13 LAB — HEMOGLOBIN A1C: Hgb A1c MFr Bld: 6.4 % (ref 4.6–6.5)

## 2013-01-22 ENCOUNTER — Other Ambulatory Visit: Payer: Self-pay | Admitting: Family Medicine

## 2013-01-22 DIAGNOSIS — E7800 Pure hypercholesterolemia, unspecified: Secondary | ICD-10-CM

## 2013-01-24 NOTE — Telephone Encounter (Signed)
Refill for lipitor sent to [name]

## 2013-01-30 ENCOUNTER — Other Ambulatory Visit: Payer: Self-pay | Admitting: *Deleted

## 2013-01-30 DIAGNOSIS — E876 Hypokalemia: Secondary | ICD-10-CM

## 2013-01-30 MED ORDER — POTASSIUM CHLORIDE CRYS ER 20 MEQ PO TBCR: 20.0000 meq | EXTENDED_RELEASE_TABLET | Freq: Every day | ORAL | Status: DC

## 2013-01-30 NOTE — Telephone Encounter (Signed)
Refill for Klor-Con sent to [facility] per pt request

## 2013-02-03 ENCOUNTER — Other Ambulatory Visit: Payer: Self-pay | Admitting: *Deleted

## 2013-02-03 MED ORDER — ATORVASTATIN CALCIUM 20 MG PO TABS: ORAL_TABLET | ORAL | Status: DC

## 2013-04-14 ENCOUNTER — Ambulatory Visit (INDEPENDENT_AMBULATORY_CARE_PROVIDER_SITE_OTHER): Payer: Managed Care, Other (non HMO) | Admitting: Family Medicine

## 2013-04-14 ENCOUNTER — Encounter: Payer: Self-pay | Admitting: Family Medicine

## 2013-04-14 VITALS — BP 128/80 | HR 80 | Temp 97.9°F | Ht 62.5 in | Wt 219.4 lb

## 2013-04-14 DIAGNOSIS — Z1331 Encounter for screening for depression: Secondary | ICD-10-CM

## 2013-04-14 DIAGNOSIS — Z23 Encounter for immunization: Secondary | ICD-10-CM

## 2013-04-14 DIAGNOSIS — Z0000 Encounter for general adult medical examination without abnormal findings: Secondary | ICD-10-CM

## 2013-04-14 DIAGNOSIS — E119 Type 2 diabetes mellitus without complications: Secondary | ICD-10-CM

## 2013-04-14 LAB — CBC WITH DIFFERENTIAL/PLATELET
Basophils Absolute: 0 K/uL (ref 0.0–0.1)
Basophils Relative: 0.5 % (ref 0.0–3.0)
Eosinophils Absolute: 0.1 K/uL (ref 0.0–0.7)
Eosinophils Relative: 1.3 % (ref 0.0–5.0)
HCT: 39.6 % (ref 36.0–46.0)
Hemoglobin: 13.6 g/dL (ref 12.0–15.0)
Lymphocytes Relative: 36.4 % (ref 12.0–46.0)
Lymphs Abs: 2.7 K/uL (ref 0.7–4.0)
MCHC: 34.3 g/dL (ref 30.0–36.0)
MCV: 83.3 fl (ref 78.0–100.0)
Monocytes Absolute: 0.5 K/uL (ref 0.1–1.0)
Monocytes Relative: 6.8 % (ref 3.0–12.0)
Neutro Abs: 4 K/uL (ref 1.4–7.7)
Neutrophils Relative %: 55 % (ref 43.0–77.0)
Platelets: 242 K/uL (ref 150.0–400.0)
RBC: 4.75 Mil/uL (ref 3.87–5.11)
RDW: 14 % (ref 11.5–14.6)
WBC: 7.4 K/uL (ref 4.5–10.5)

## 2013-04-14 LAB — TSH: TSH: 1.42 u[IU]/mL (ref 0.35–5.50)

## 2013-04-14 NOTE — Assessment & Plan Note (Signed)
Continue to follow closely.  Check A1C and start meds prn.

## 2013-04-14 NOTE — Progress Notes (Signed)
  Subjective:    Patient ID: Priscilla Thompson, female    DOB: 23-Jun-1951, 62 y.o.   MRN: [id number]  HPI CPE- UTD on pap, mammo, DEXA (normal), colonoscopy.  UTD on eye exam (April 14th)   Review of Systems Patient reports no vision/ hearing changes, adenopathy,fever, weight change,  persistant/recurrent hoarseness , swallowing issues, chest pain, palpitations, edema, persistant/recurrent cough, hemoptysis, dyspnea (rest/exertional/paroxysmal nocturnal), gastrointestinal bleeding (melena, rectal bleeding), abdominal pain, significant heartburn, bowel changes, GU symptoms (dysuria, hematuria, incontinence), Gyn symptoms (abnormal  bleeding, pain),  syncope, focal weakness, memory loss, numbness & tingling, skin/hair/nail changes, abnormal bruising or bleeding, anxiety, or depression.     Objective:   Physical Exam General Appearance:    Alert, cooperative, no distress, appears stated age  Head:    Normocephalic, without obvious abnormality, atraumatic  Eyes:    PERRL, conjunctiva/corneas clear, EOM's intact, fundi    benign, both eyes  Ears:    Normal TM's and external ear canals, both ears  Nose:   Nares normal, septum midline, mucosa normal, no drainage    or sinus tenderness  Throat:   Lips, mucosa, and tongue normal; teeth and gums normal  Neck:   Supple, symmetrical, trachea midline, no adenopathy;    Thyroid: no enlargement/tenderness/nodules  Back:     Symmetric, no curvature, ROM normal, no CVA tenderness  Lungs:     Clear to auscultation bilaterally, respirations unlabored  Chest Wall:    No tenderness or deformity   Heart:    Regular rate and rhythm, S1 and S2 normal, no murmur, rub   or gallop  Breast Exam:    Deferred to GYN  Abdomen:     Soft, non-tender, bowel sounds active all four quadrants,    no masses, no organomegaly  Genitalia:    Deferred to GYN  Rectal:    Extremities:   Extremities normal, atraumatic, no cyanosis or edema  Pulses:   2+ and symmetric all  extremities  Skin:   Skin color, texture, turgor normal, no rashes or lesions  Lymph nodes:   Cervical, supraclavicular, and axillary nodes normal  Neurologic:   CNII-XII intact, normal strength, sensation and reflexes    throughout          Assessment & Plan:

## 2013-04-14 NOTE — Assessment & Plan Note (Signed)
Pt's PE WNL.  UTD on colonoscopy, pap, mammo, DEXA.  Check labs.  Update Tdap today.  Anticipatory guidance provided.

## 2013-04-14 NOTE — Patient Instructions (Addendum)
Follow up in 3-4 months to recheck sugars We'll notify you of your lab results and make any changes if needed Try and make healthy food choices and get regular exercise Keep up the good work! Call with any questions or concerns Happy Spring!

## 2013-04-17 LAB — BASIC METABOLIC PANEL
BUN: 18 mg/dL (ref 6–23)
CO2: 28 mEq/L (ref 19–32)
Chloride: 104 mEq/L (ref 96–112)
Creatinine, Ser: 1.1 mg/dL (ref 0.4–1.2)
Potassium: 3.6 mEq/L (ref 3.5–5.1)

## 2013-04-17 LAB — HEPATIC FUNCTION PANEL
ALT: 29 U/L (ref 0–35)
AST: 30 U/L (ref 0–37)
Total Bilirubin: 0.7 mg/dL (ref 0.3–1.2)
Total Protein: 8.3 g/dL (ref 6.0–8.3)

## 2013-04-17 LAB — LIPID PANEL
Cholesterol: 147 mg/dL (ref 0–200)
VLDL: 11.2 mg/dL (ref 0.0–40.0)

## 2013-04-19 LAB — VITAMIN D 1,25 DIHYDROXY
Vitamin D 1, 25 (OH)2 Total: 32 pg/mL (ref 18–72)
Vitamin D2 1, 25 (OH)2: <8 pg/mL
Vitamin D3 1, 25 (OH)2: 32 pg/mL

## 2013-05-03 ENCOUNTER — Other Ambulatory Visit: Payer: Self-pay | Admitting: Family Medicine

## 2013-05-05 NOTE — Telephone Encounter (Signed)
Rx sent to the pharmacy by e-script.//AB/CMA 

## 2013-07-17 ENCOUNTER — Ambulatory Visit (INDEPENDENT_AMBULATORY_CARE_PROVIDER_SITE_OTHER): Payer: Managed Care, Other (non HMO) | Admitting: Family Medicine

## 2013-07-17 ENCOUNTER — Encounter: Payer: Self-pay | Admitting: Family Medicine

## 2013-07-17 VITALS — BP 128/70 | HR 85 | Temp 98.3°F | Ht 60.0 in | Wt 217.8 lb

## 2013-07-17 DIAGNOSIS — E119 Type 2 diabetes mellitus without complications: Secondary | ICD-10-CM

## 2013-07-17 NOTE — Progress Notes (Signed)
  Subjective:    Patient ID: Priscilla Thompson, female    DOB: 23-Sep-1951, 62 y.o.   MRN: [id number]  HPI DM- chronic problem, currently attempting to control w/ healthy diet and regular exercise.  Denies symptomatic lows.  Denies CP, SOB, HAs, visual changes, edema.  On Lisinopril for renal protection.  BP at goal today.  UTD on eye exam- 03/27/13 (regular eye exam), 06/01/13 ([facility] for glaucoma).     Review of Systems For ROS see HPI     Objective:   Physical Exam  Vitals reviewed. Constitutional: She is oriented to person, place, and time. She appears well-developed and well-nourished. No distress.  HENT:  Head: Normocephalic and atraumatic.  Eyes: Conjunctivae and EOM are normal. Pupils are equal, round, and reactive to light.  Neck: Normal range of motion. Neck supple. No thyromegaly present.  Cardiovascular: Normal rate, regular rhythm, normal heart sounds and intact distal pulses.   No murmur heard. Pulmonary/Chest: Effort normal and breath sounds normal. No respiratory distress.  Abdominal: Soft. She exhibits no distension. There is no tenderness.  Musculoskeletal: She exhibits no edema.  Lymphadenopathy:    She has no cervical adenopathy.  Neurological: She is alert and oriented to person, place, and time.  Skin: Skin is warm and dry.  Psychiatric: She has a normal mood and affect. Her behavior is normal.          Assessment & Plan:

## 2013-07-17 NOTE — Assessment & Plan Note (Signed)
Chronic problem.  Diet controlled.  Attempting to control w/ healthy diet and regular exercise.  UTD on eye exam.  Check labs.  Adjust meds prn

## 2013-07-17 NOTE — Patient Instructions (Addendum)
Follow up in 3 months to recheck sugars and cholesterol Keep up the good work!  You look great! Call with any questions or concerns Happy Early Birthday!!!

## 2013-07-18 LAB — BASIC METABOLIC PANEL
BUN: 18 mg/dL (ref 6–23)
CO2: 28 mEq/L (ref 19–32)
Chloride: 104 mEq/L (ref 96–112)
Creatinine, Ser: 0.9 mg/dL (ref 0.4–1.2)
Glucose, Bld: 81 mg/dL (ref 70–99)

## 2013-07-18 LAB — HEMOGLOBIN A1C: Hgb A1c MFr Bld: 6.3 % (ref 4.6–6.5)

## 2013-09-14 ENCOUNTER — Encounter: Payer: Self-pay | Admitting: Family Medicine

## 2013-09-15 ENCOUNTER — Encounter: Payer: Self-pay | Admitting: Family Medicine

## 2013-10-09 ENCOUNTER — Other Ambulatory Visit: Payer: Self-pay | Admitting: Family Medicine

## 2013-10-09 NOTE — Telephone Encounter (Signed)
Med filled.  

## 2013-10-19 ENCOUNTER — Other Ambulatory Visit: Payer: Self-pay

## 2013-11-06 ENCOUNTER — Encounter: Payer: Self-pay | Admitting: Family Medicine

## 2013-11-06 ENCOUNTER — Ambulatory Visit (INDEPENDENT_AMBULATORY_CARE_PROVIDER_SITE_OTHER): Payer: Managed Care, Other (non HMO) | Admitting: Family Medicine

## 2013-11-06 VITALS — BP 130/80 | HR 96 | Temp 98.4°F | Resp 16 | Wt 221.0 lb

## 2013-11-06 DIAGNOSIS — E119 Type 2 diabetes mellitus without complications: Secondary | ICD-10-CM

## 2013-11-06 DIAGNOSIS — I10 Essential (primary) hypertension: Secondary | ICD-10-CM

## 2013-11-06 DIAGNOSIS — Z23 Encounter for immunization: Secondary | ICD-10-CM

## 2013-11-06 DIAGNOSIS — J069 Acute upper respiratory infection, unspecified: Secondary | ICD-10-CM

## 2013-11-06 DIAGNOSIS — K649 Unspecified hemorrhoids: Secondary | ICD-10-CM | POA: Insufficient documentation

## 2013-11-06 DIAGNOSIS — E785 Hyperlipidemia, unspecified: Secondary | ICD-10-CM

## 2013-11-06 LAB — CBC WITH DIFFERENTIAL/PLATELET
Basophils Relative: 0.7 % (ref 0.0–3.0)
Eosinophils Absolute: 0.2 10*3/uL (ref 0.0–0.7)
HCT: 37.7 % (ref 36.0–46.0)
Hemoglobin: 12.3 g/dL (ref 12.0–15.0)
Lymphocytes Relative: 37 % (ref 12.0–46.0)
MCHC: 32.6 g/dL (ref 30.0–36.0)
MCV: 85.3 fl (ref 78.0–100.0)
Monocytes Absolute: 0.7 10*3/uL (ref 0.1–1.0)
Neutro Abs: 4.3 10*3/uL (ref 1.4–7.7)
RBC: 4.41 Mil/uL (ref 3.87–5.11)

## 2013-11-06 LAB — BASIC METABOLIC PANEL
BUN: 10 mg/dL (ref 6–23)
Chloride: 102 mEq/L (ref 96–112)
Creatinine, Ser: 0.9 mg/dL (ref 0.4–1.2)

## 2013-11-06 LAB — LIPID PANEL
Cholesterol: 126 mg/dL (ref 0–200)
LDL Cholesterol: 63 mg/dL (ref 0–99)
Triglycerides: 43 mg/dL (ref 0.0–149.0)

## 2013-11-06 LAB — HEPATIC FUNCTION PANEL
ALT: 25 U/L (ref 0–35)
Total Bilirubin: 0.5 mg/dL (ref 0.3–1.2)

## 2013-11-06 NOTE — Progress Notes (Signed)
  Subjective:    Patient ID: Priscilla Thompson, female    DOB: Oct 06, 1951, 62 y.o.   MRN: [id number]  HPI Pre visit review using our clinic review tool, if applicable. No additional management support is needed unless otherwise documented below in the visit note.  HTN- chronic problem, on Lisinopril HCTZ, Amlodipine.  No CP, SOB, HAs, visual changes, edema.  DM- chronic problem, attempting to control w/ diet and exercise.  No symptomatic lows.  No numbness/tingling hands/feet.  UTD on eye exam.  Hyperlipidemia- chronic problem, on Lipitor.  Denies abd pain, N/V, myalgias.  URI- sxs started 11/17.  Cough is improving, no chest congestion.  + nasal congestion.  No fevers.  Resolved laryngitis.  Colonoscopy 03/10/13, due in 5 yrs   Review of Systems For ROS see HPI     Objective:   Physical Exam  Vitals reviewed. Constitutional: She is oriented to person, place, and time. She appears well-developed and well-nourished. No distress.  HENT:  Head: Normocephalic and atraumatic.  Eyes: Conjunctivae and EOM are normal. Pupils are equal, round, and reactive to light.  Neck: Normal range of motion. Neck supple. No thyromegaly present.  Cardiovascular: Normal rate, regular rhythm, normal heart sounds and intact distal pulses.   No murmur heard. Pulmonary/Chest: Effort normal and breath sounds normal. No respiratory distress.  Abdominal: Soft. She exhibits no distension. There is no tenderness.  Musculoskeletal: She exhibits no edema.  Lymphadenopathy:    She has no cervical adenopathy.  Neurological: She is alert and oriented to person, place, and time.  Skin: Skin is warm and dry.  Psychiatric: She has a normal mood and affect. Her behavior is normal.          Assessment & Plan:

## 2013-11-06 NOTE — Assessment & Plan Note (Signed)
Pt's sxs consistent w/ resolving viral illness.  No evidence of bacterial infxn.  Reviewed supportive care and red flags that should prompt return.  Pt expressed understanding and is in agreement w/ plan.

## 2013-11-06 NOTE — Assessment & Plan Note (Addendum)
Chronic problem.  Has never been on meds.  UTD on eye exam.  Again encouraged healthy diet and regular exercise.  Check labs.  Start meds prn.  Will follow.

## 2013-11-06 NOTE — Assessment & Plan Note (Signed)
Chronic problem, adequate control.  Asymptomatic.  Check labs.  No anticipated med changes. 

## 2013-11-06 NOTE — Assessment & Plan Note (Signed)
Chronic problem, tolerating statin w/out difficulty.  Check labs.  Adjust meds prn  

## 2013-11-06 NOTE — Assessment & Plan Note (Signed)
New.  Pt mentioned this at the end of the visit, 'oh by the way...'  Pt reports BRBPR on toilet tissue after BM.  Encouraged pt to call GI.  Will follow.

## 2013-11-06 NOTE — Patient Instructions (Signed)
Follow up in 3-4 months to recheck Diabetes We'll notify you of your lab results and make any changes if needed Call Dr Loreta Ave about the bleeding/hemorrhoids Drink plenty of fluids Get regular exercise and make healthy food choices Call with any questions or concerns Happy Thanksgiving!!!

## 2013-11-07 ENCOUNTER — Encounter: Payer: Self-pay | Admitting: Family Medicine

## 2014-02-15 ENCOUNTER — Encounter: Payer: Self-pay | Admitting: Family Medicine

## 2014-02-15 ENCOUNTER — Ambulatory Visit (INDEPENDENT_AMBULATORY_CARE_PROVIDER_SITE_OTHER): Payer: Managed Care, Other (non HMO) | Admitting: Family Medicine

## 2014-02-15 VITALS — BP 134/84 | HR 91 | Temp 98.2°F | Resp 16 | Wt 219.2 lb

## 2014-02-15 DIAGNOSIS — E119 Type 2 diabetes mellitus without complications: Secondary | ICD-10-CM

## 2014-02-15 DIAGNOSIS — R002 Palpitations: Secondary | ICD-10-CM

## 2014-02-15 LAB — BASIC METABOLIC PANEL
BUN: 15 mg/dL (ref 6–23)
CHLORIDE: 103 meq/L (ref 96–112)
CO2: 27 mEq/L (ref 19–32)
CREATININE: 0.9 mg/dL (ref 0.4–1.2)
Calcium: 9.2 mg/dL (ref 8.4–10.5)
GFR: 84.45 mL/min (ref >60.00)
Glucose, Bld: 84 mg/dL (ref 70–99)
Potassium: 3.4 mEq/L — ABNORMAL LOW (ref 3.5–5.1)
Sodium: 136 mEq/L (ref 135–145)

## 2014-02-15 LAB — HEMOGLOBIN A1C: Hgb A1c MFr Bld: 6.4 % (ref 4.6–6.5)

## 2014-02-15 NOTE — Progress Notes (Signed)
Pre visit review using our clinic review tool, if applicable. No additional management support is needed unless otherwise documented below in the visit note. 

## 2014-02-15 NOTE — Progress Notes (Signed)
   Subjective:    Patient ID: Priscilla Thompson, female    DOB: [birth year], 63 y.o.   MRN: [id number]  HPI DM- chronic problem, controlling w/ diet.  On ACE for renal protection.  CBGs <113.  No CP, SOB, HAs, visual changes, N/V/D, edema.  UTD on eye exam ([facility]).  No numbness/tingling hands/feet.   Review of Systems For ROS see HPI     Objective:   Physical Exam  Constitutional: She is oriented to person, place, and time. She appears well-developed and well-nourished. No distress.  HENT:  Head: Normocephalic and atraumatic.  Eyes: Conjunctivae and EOM are normal. Pupils are equal, round, and reactive to light.  Neck: Normal range of motion. Neck supple. No thyromegaly present.  Cardiovascular: Normal rate, regular rhythm, normal heart sounds and intact distal pulses.   No murmur heard. Pulmonary/Chest: Effort normal and breath sounds normal. No respiratory distress.  Abdominal: Soft. She exhibits no distension. There is no tenderness.  Musculoskeletal: She exhibits no edema.  Lymphadenopathy:    She has no cervical adenopathy.  Neurological: She is alert and oriented to person, place, and time.  Skin: Skin is warm and dry.  Psychiatric: She has a normal mood and affect. Her behavior is normal.          Assessment & Plan:

## 2014-02-15 NOTE — Patient Instructions (Signed)
Follow up in 3-4 months to recheck sugar and cholesterol We'll notify you of your lab results and make any changes if needed Keep up the good work!  You look great! Call with any questions or concerns [place]!!

## 2014-02-15 NOTE — Assessment & Plan Note (Signed)
Pt's CBGs well controlled.  Currently asymptomatic.  On ACE, UTD on eye exam.  Check labs.  Adjust meds prn

## 2014-02-16 ENCOUNTER — Telehealth: Payer: Self-pay

## 2014-02-16 NOTE — Telephone Encounter (Signed)
Relevant patient education assigned to patient using Emmi. ° °

## 2014-02-28 ENCOUNTER — Other Ambulatory Visit: Payer: Self-pay | Admitting: Obstetrics and Gynecology

## 2014-02-28 LAB — HM MAMMOGRAPHY: HM MAMMO: NEGATIVE

## 2014-04-28 ENCOUNTER — Other Ambulatory Visit: Payer: Self-pay | Admitting: Family Medicine

## 2014-04-30 NOTE — Telephone Encounter (Signed)
Med filled.  

## 2014-06-05 LAB — HM DIABETES EYE EXAM

## 2014-06-07 ENCOUNTER — Telehealth: Payer: Self-pay

## 2014-06-07 NOTE — Telephone Encounter (Signed)
Medication and allergies:  Reviewed and updated  90 day supply/mail order: [facility] - [place], [place] - [address] AT [address] [facility] [address] Local pharmacy:  [id number] - [place], [place] - [address]   Immunizations due: Shingles-declined   A/P: No changes to personal, family history or past surgical hx Pap- 02/28/14-negative  CCS- 2014, repeat in 2019 per patient MMG- 02/28/14- negative per patient Flu- 11/06/13  Td- 04/14/13   To Discuss with Joanmarie Tsang: Nothing at this time.

## 2014-06-07 NOTE — Telephone Encounter (Signed)
Left message for call back Non-identifiable   Pap- 02/28/14-negative Flu- 11/06/13 Td- 04/14/13

## 2014-06-08 ENCOUNTER — Encounter: Payer: Self-pay | Admitting: Family Medicine

## 2014-06-08 ENCOUNTER — Encounter: Payer: Self-pay | Admitting: General Practice

## 2014-06-08 ENCOUNTER — Ambulatory Visit (INDEPENDENT_AMBULATORY_CARE_PROVIDER_SITE_OTHER): Payer: Managed Care, Other (non HMO) | Admitting: Family Medicine

## 2014-06-08 VITALS — BP 126/80 | HR 99 | Temp 98.3°F | Resp 16 | Ht 62.25 in | Wt 218.0 lb

## 2014-06-08 DIAGNOSIS — E669 Obesity, unspecified: Secondary | ICD-10-CM | POA: Insufficient documentation

## 2014-06-08 DIAGNOSIS — Z0000 Encounter for general adult medical examination without abnormal findings: Secondary | ICD-10-CM

## 2014-06-08 DIAGNOSIS — E119 Type 2 diabetes mellitus without complications: Secondary | ICD-10-CM

## 2014-06-08 LAB — CBC WITH DIFFERENTIAL/PLATELET
Basophils Absolute: 0 10*3/uL (ref 0.0–0.1)
Basophils Relative: 0.3 % (ref 0.0–3.0)
EOS PCT: 1.1 % (ref 0.0–5.0)
Eosinophils Absolute: 0.1 10*3/uL (ref 0.0–0.7)
HEMATOCRIT: 39.9 % (ref 36.0–46.0)
HEMOGLOBIN: 13.1 g/dL (ref 12.0–15.0)
LYMPHS PCT: 36.6 % (ref 12.0–46.0)
Lymphs Abs: 2.5 10*3/uL (ref 0.7–4.0)
MCHC: 33 g/dL (ref 30.0–36.0)
MCV: 84.8 fl (ref 78.0–100.0)
MONOS PCT: 6.5 % (ref 3.0–12.0)
Monocytes Absolute: 0.4 10*3/uL (ref 0.1–1.0)
NEUTROS ABS: 3.8 10*3/uL (ref 1.4–7.7)
Neutrophils Relative %: 55.5 % (ref 43.0–77.0)
Platelets: 249 10*3/uL (ref 150.0–400.0)
RBC: 4.7 Mil/uL (ref 3.87–5.11)
RDW: 14.1 % (ref 11.5–15.5)
WBC: 6.8 10*3/uL (ref 4.0–10.5)

## 2014-06-08 LAB — BASIC METABOLIC PANEL
BUN: 13 mg/dL (ref 6–23)
CALCIUM: 9.2 mg/dL (ref 8.4–10.5)
CO2: 28 mEq/L (ref 19–32)
CREATININE: 0.9 mg/dL (ref 0.4–1.2)
Chloride: 104 mEq/L (ref 96–112)
GFR: 85.5 mL/min (ref >60.00)
Glucose, Bld: 93 mg/dL (ref 70–99)
POTASSIUM: 3.6 meq/L (ref 3.5–5.1)
Sodium: 139 mEq/L (ref 135–145)

## 2014-06-08 LAB — HEPATIC FUNCTION PANEL
ALBUMIN: 4.2 g/dL (ref 3.5–5.2)
ALT: 24 U/L (ref 0–35)
AST: 18 U/L (ref 0–37)
Alkaline Phosphatase: 54 U/L (ref 39–117)
Bilirubin, Direct: 0.1 mg/dL (ref 0.0–0.3)
Total Bilirubin: 0.6 mg/dL (ref 0.2–1.2)
Total Protein: 7.8 g/dL (ref 6.0–8.3)

## 2014-06-08 LAB — TSH: TSH: 1.11 u[IU]/mL (ref 0.35–4.50)

## 2014-06-08 LAB — LIPID PANEL
Cholesterol: 128 mg/dL (ref 0–200)
HDL: 62.2 mg/dL (ref >39.00)
LDL CALC: 56 mg/dL (ref 0–99)
NonHDL: 65.8
Total CHOL/HDL Ratio: 2
Triglycerides: 48 mg/dL (ref 0.0–149.0)
VLDL: 9.6 mg/dL (ref 0.0–40.0)

## 2014-06-08 LAB — HEMOGLOBIN A1C: Hgb A1c MFr Bld: 6.4 % (ref 4.6–6.5)

## 2014-06-08 LAB — VITAMIN D 25 HYDROXY (VIT D DEFICIENCY, FRACTURES): VITD: 58.69 ng/mL

## 2014-06-08 NOTE — Patient Instructions (Signed)
Follow up in 3-4 months to recheck diabetes We'll notify you of your lab results and make any changes if needed Try and make healthy food choices and get regular exercise Call with any questions or concerns Have a great summer!!

## 2014-06-08 NOTE — Progress Notes (Signed)
Pre visit review using our clinic review tool, if applicable. No additional management support is needed unless otherwise documented below in the visit note. 

## 2014-06-08 NOTE — Assessment & Plan Note (Signed)
Pt's PE WNL w/ exception of obesity.  UTD on health maintenance.  Check labs.  Anticipatory guidance provided.  

## 2014-06-08 NOTE — Progress Notes (Signed)
   Subjective:    Patient ID: Priscilla Thompson, female    DOB: Apr 29, 1951, 63 y.o.   MRN: [id number]  HPI CPE- UTD on colonoscopy, DEXA, mammo, pap.   Review of Systems Patient reports no vision/ hearing changes, adenopathy,fever, weight change,  persistant/recurrent hoarseness , swallowing issues, chest pain, palpitations, edema, persistant/recurrent cough, hemoptysis, dyspnea (rest/exertional/paroxysmal nocturnal), gastrointestinal bleeding (melena, rectal bleeding), abdominal pain, significant heartburn, bowel changes, GU symptoms (dysuria, hematuria, incontinence), Gyn symptoms (abnormal  bleeding, pain),  syncope, focal weakness, memory loss, numbness & tingling, skin/hair/nail changes, abnormal bruising or bleeding, anxiety, or depression.     Objective:   Physical Exam General Appearance:    Alert, cooperative, no distress, appears stated age, obese  Head:    Normocephalic, without obvious abnormality, atraumatic  Eyes:    PERRL, conjunctiva/corneas clear, EOM's intact, fundi    benign, both eyes  Ears:    Normal TM's and external ear canals, both ears  Nose:   Nares normal, septum midline, mucosa normal, no drainage    or sinus tenderness  Throat:   Lips, mucosa, and tongue normal; teeth and gums normal  Neck:   Supple, symmetrical, trachea midline, no adenopathy;    Thyroid: no enlargement/tenderness/nodules  Back:     Symmetric, no curvature, ROM normal, no CVA tenderness  Lungs:     Clear to auscultation bilaterally, respirations unlabored  Chest Wall:    No tenderness or deformity   Heart:    Regular rate and rhythm, S1 and S2 normal, no murmur, rub   or gallop  Breast Exam:    Deferred to GYN  Abdomen:     Soft, non-tender, bowel sounds active all four quadrants,    no masses, no organomegaly  Genitalia:    Deferred to GYN  Rectal:    Extremities:   Extremities normal, atraumatic, no cyanosis or edema  Pulses:   2+ and symmetric all extremities  Skin:   Skin color,  texture, turgor normal, no rashes or lesions  Lymph nodes:   Cervical, supraclavicular, and axillary nodes normal  Neurologic:   CNII-XII intact, normal strength, sensation and reflexes    throughout          Assessment & Plan:

## 2014-06-08 NOTE — Assessment & Plan Note (Signed)
Pt continues to control w/ diet but admits she has been doing poorly.  Check labs.  Start meds prn.

## 2014-07-08 ENCOUNTER — Other Ambulatory Visit: Payer: Self-pay | Admitting: Family Medicine

## 2014-07-09 NOTE — Telephone Encounter (Signed)
Med filled.  

## 2014-08-17 ENCOUNTER — Telehealth: Payer: Self-pay | Admitting: Family Medicine

## 2014-08-17 MED ORDER — ATORVASTATIN CALCIUM 20 MG PO TABS: ORAL_TABLET | ORAL | Status: DC

## 2014-08-17 NOTE — Telephone Encounter (Signed)
Caller name: Shandria Relation to pt: Call back number: [phone] Pharmacy:  [facility] [address]   Reason for call:  Pt has sent of for the refill on rx atorvastatin (LIPITOR) 20 MG tablet , but it will take 7 to 10 days to arrive and she is out.  She would like a small refill, maybe 10 pills, sent to the pharmacy listed above. Please call pt to advise we can do this, and when we do this. Thanks.

## 2014-08-17 NOTE — Telephone Encounter (Signed)
Med filled and pt notified.  

## 2014-10-08 ENCOUNTER — Ambulatory Visit (INDEPENDENT_AMBULATORY_CARE_PROVIDER_SITE_OTHER): Payer: Managed Care, Other (non HMO) | Admitting: Family Medicine

## 2014-10-08 ENCOUNTER — Encounter: Payer: Self-pay | Admitting: Family Medicine

## 2014-10-08 VITALS — BP 124/86 | HR 72 | Temp 98.1°F | Resp 16 | Ht 62.5 in | Wt 223.4 lb

## 2014-10-08 DIAGNOSIS — E119 Type 2 diabetes mellitus without complications: Secondary | ICD-10-CM

## 2014-10-08 DIAGNOSIS — Z23 Encounter for immunization: Secondary | ICD-10-CM

## 2014-10-08 NOTE — Progress Notes (Signed)
Pre visit review using our clinic review tool, if applicable. No additional management support is needed unless otherwise documented below in the visit note. 

## 2014-10-08 NOTE — Patient Instructions (Signed)
Follow up in 3-4 months to recheck diabetes and cholesterol Schedule a nurse visit in 6 weeks for the shingles shot We'll notify you of your lab results and make any changes if needed Try and make healthy food choices and get some walking if able Call Dr Dierdre ForthBeekman!!! Call with any questions or concerns Happy Fall!!!

## 2014-10-08 NOTE — Assessment & Plan Note (Signed)
Pt continues to gain weight and is upset by this.  Admits that she has a weakness for sweets.  UTD on eye exam.  Asymptomatic.  Check labs.  Start meds prn.

## 2014-10-08 NOTE — Progress Notes (Signed)
   Subjective:    Patient ID: Priscilla Thompson, female    DOB: [birth year], 63 y.o.   MRN: [id number]  HPI DM- chronic problem. Diet controlled.  UTD on eye exam.  On ACE.  Denies CP, SOB, HAs, visual changes, edema, abd pain, N/V, numbness/tingling hands/feet.  Adequate BP control.  Has gained 5 lbs since last visit.   Review of Systems For ROS see HPI     Objective:   Physical Exam  Vitals reviewed. Constitutional: She is oriented to person, place, and time. She appears well-developed and well-nourished. No distress.  HENT:  Head: Normocephalic and atraumatic.  Eyes: Conjunctivae and EOM are normal. Pupils are equal, round, and reactive to light.  Neck: Normal range of motion. Neck supple. No thyromegaly present.  Cardiovascular: Normal rate, regular rhythm, normal heart sounds and intact distal pulses.   No murmur heard. Pulmonary/Chest: Effort normal and breath sounds normal. No respiratory distress.  Abdominal: Soft. She exhibits no distension. There is no tenderness.  Musculoskeletal: She exhibits no edema.  Lymphadenopathy:    She has no cervical adenopathy.  Neurological: She is alert and oriented to person, place, and time.  Skin: Skin is warm and dry.  Psychiatric: She has a normal mood and affect. Her behavior is normal.          Assessment & Plan:

## 2014-10-09 LAB — BASIC METABOLIC PANEL
BUN: 15 mg/dL (ref 6–23)
CALCIUM: 9 mg/dL (ref 8.4–10.5)
CO2: 25 mEq/L (ref 19–32)
Chloride: 103 mEq/L (ref 96–112)
Creatinine, Ser: 0.9 mg/dL (ref 0.4–1.2)
GFR: 77.07 mL/min (ref >60.00)
GLUCOSE: 84 mg/dL (ref 70–99)
Potassium: 3.6 mEq/L (ref 3.5–5.1)
Sodium: 136 mEq/L (ref 135–145)

## 2014-10-09 LAB — HEMOGLOBIN A1C: Hgb A1c MFr Bld: 6.3 % (ref 4.6–6.5)

## 2014-11-09 ENCOUNTER — Other Ambulatory Visit: Payer: Self-pay | Admitting: Family Medicine

## 2014-11-09 NOTE — Telephone Encounter (Signed)
Med filled.  

## 2014-11-20 ENCOUNTER — Ambulatory Visit (INDEPENDENT_AMBULATORY_CARE_PROVIDER_SITE_OTHER): Payer: Managed Care, Other (non HMO)

## 2014-11-20 DIAGNOSIS — Z23 Encounter for immunization: Secondary | ICD-10-CM

## 2014-11-20 NOTE — Progress Notes (Signed)
Pre visit review using our clinic review tool, if applicable. No additional management support is needed unless otherwise documented below in the visit note.  Patient tolerated injection well.  

## 2015-02-08 ENCOUNTER — Encounter: Payer: Self-pay | Admitting: Family Medicine

## 2015-02-08 ENCOUNTER — Ambulatory Visit (INDEPENDENT_AMBULATORY_CARE_PROVIDER_SITE_OTHER): Payer: Managed Care, Other (non HMO) | Admitting: Family Medicine

## 2015-02-08 VITALS — BP 122/80 | HR 98 | Temp 98.0°F | Resp 16 | Wt 218.5 lb

## 2015-02-08 DIAGNOSIS — E785 Hyperlipidemia, unspecified: Secondary | ICD-10-CM

## 2015-02-08 DIAGNOSIS — E1169 Type 2 diabetes mellitus with other specified complication: Secondary | ICD-10-CM

## 2015-02-08 DIAGNOSIS — I10 Essential (primary) hypertension: Secondary | ICD-10-CM

## 2015-02-08 DIAGNOSIS — E119 Type 2 diabetes mellitus without complications: Secondary | ICD-10-CM

## 2015-02-08 LAB — CBC WITH DIFFERENTIAL/PLATELET
BASOS PCT: 0.4 % (ref 0.0–3.0)
Basophils Absolute: 0 10*3/uL (ref 0.0–0.1)
EOS ABS: 0.1 10*3/uL (ref 0.0–0.7)
Eosinophils Relative: 1 % (ref 0.0–5.0)
HCT: 39.7 % (ref 36.0–46.0)
Hemoglobin: 13.3 g/dL (ref 12.0–15.0)
LYMPHS PCT: 36.5 % (ref 12.0–46.0)
Lymphs Abs: 2.5 10*3/uL (ref 0.7–4.0)
MCHC: 33.5 g/dL (ref 30.0–36.0)
MCV: 83.4 fl (ref 78.0–100.0)
Monocytes Absolute: 0.5 10*3/uL (ref 0.1–1.0)
Monocytes Relative: 7.2 % (ref 3.0–12.0)
Neutro Abs: 3.7 10*3/uL (ref 1.4–7.7)
Neutrophils Relative %: 54.9 % (ref 43.0–77.0)
Platelets: 232 10*3/uL (ref 150.0–400.0)
RBC: 4.76 Mil/uL (ref 3.87–5.11)
RDW: 13.9 % (ref 11.5–15.5)
WBC: 6.8 10*3/uL (ref 4.0–10.5)

## 2015-02-08 LAB — HEPATIC FUNCTION PANEL
ALBUMIN: 4.2 g/dL (ref 3.5–5.2)
ALK PHOS: 56 U/L (ref 39–117)
ALT: 22 U/L (ref 0–35)
AST: 18 U/L (ref 0–37)
BILIRUBIN TOTAL: 0.5 mg/dL (ref 0.2–1.2)
Bilirubin, Direct: 0.1 mg/dL (ref 0.0–0.3)
Total Protein: 7.5 g/dL (ref 6.0–8.3)

## 2015-02-08 LAB — BASIC METABOLIC PANEL
BUN: 13 mg/dL (ref 6–23)
CHLORIDE: 103 meq/L (ref 96–112)
CO2: 31 meq/L (ref 19–32)
Calcium: 9.6 mg/dL (ref 8.4–10.5)
Creatinine, Ser: 0.87 mg/dL (ref 0.40–1.20)
GFR: 84.18 mL/min (ref >60.00)
Glucose, Bld: 90 mg/dL (ref 70–99)
POTASSIUM: 3.5 meq/L (ref 3.5–5.1)
SODIUM: 139 meq/L (ref 135–145)

## 2015-02-08 LAB — LIPID PANEL
CHOL/HDL RATIO: 2
Cholesterol: 137 mg/dL (ref 0–200)
HDL: 60.9 mg/dL (ref >39.00)
LDL CALC: 66 mg/dL (ref 0–99)
NonHDL: 76.1
Triglycerides: 53 mg/dL (ref 0.0–149.0)
VLDL: 10.6 mg/dL (ref 0.0–40.0)

## 2015-02-08 LAB — HEMOGLOBIN A1C: HEMOGLOBIN A1C: 6.4 % (ref 4.6–6.5)

## 2015-02-08 NOTE — Progress Notes (Signed)
Pre visit review using our clinic review tool, if applicable. No additional management support is needed unless otherwise documented below in the visit note. 

## 2015-02-08 NOTE — Assessment & Plan Note (Signed)
Chronic problem.  Well controlled.  Asymptomatic.  Check labs.  No anticipated med changes. 

## 2015-02-08 NOTE — Patient Instructions (Signed)
Schedule your complete physical after 6/26 We'll notify you of your lab results and make any changes if needed Keep up the good work!  You look great! Call with any questions or concerns Happy Spring!!!

## 2015-02-08 NOTE — Progress Notes (Signed)
   Subjective:    Patient ID: Priscilla Thompson, female    DOB: 10-13-1951, 64 y.o.   MRN: [id number]  HPI HTN- chronic problem, on Lisinopril HCTZ, Amlodipine.  Good med compliance.  Denies CP, SOB, HAs, visual changes, edema.  Hyperlipidemia- chronic problem, on Lipitor.  Denies abd pain, N/V, myalgias.  DM- chronic problem, diet controlled to this point.  UTD on eye and foot exam.  On ACE for renal protection.  Has lost 5 lbs!  Pt reports cutting back on portion size.  Denies numbness/tingling hands/feet   Review of Systems For ROS see HPI   Reviewed meds, allergies, problem list, and PMH in chart     Objective:   Physical Exam  Constitutional: She is oriented to person, place, and time. She appears well-developed and well-nourished. No distress.  HENT:  Head: Normocephalic and atraumatic.  Eyes: Conjunctivae and EOM are normal. Pupils are equal, round, and reactive to light.  Neck: Normal range of motion. Neck supple. No thyromegaly present.  Cardiovascular: Normal rate, regular rhythm, normal heart sounds and intact distal pulses.   No murmur heard. Pulmonary/Chest: Effort normal and breath sounds normal. No respiratory distress.  Abdominal: Soft. She exhibits no distension. There is no tenderness.  Musculoskeletal: She exhibits no edema.  Lymphadenopathy:    She has no cervical adenopathy.  Neurological: She is alert and oriented to person, place, and time.  Skin: Skin is warm and dry.  Psychiatric: She has a normal mood and affect. Her behavior is normal.  Vitals reviewed.         Assessment & Plan:

## 2015-02-08 NOTE — Assessment & Plan Note (Signed)
Chronic problem.  Pt is UTD on eye exam and foot exam.  On ACE for renal protection.  Pt has recently lost 5 lbs- applauded her efforts.  Check labs.  Will not start meds unless necessary.

## 2015-02-08 NOTE — Assessment & Plan Note (Signed)
Chronic problem.  Tolerating statin w/o difficulty.  Check labs.  Adjust meds prn  

## 2015-03-18 ENCOUNTER — Other Ambulatory Visit: Payer: Self-pay | Admitting: Obstetrics and Gynecology

## 2015-03-18 LAB — HM PAP SMEAR: HM Pap smear: NORMAL

## 2015-03-18 LAB — HM DEXA SCAN: HM Dexa Scan: NORMAL

## 2015-03-18 LAB — HM MAMMOGRAPHY

## 2015-03-19 LAB — CYTOLOGY - PAP

## 2015-03-25 ENCOUNTER — Other Ambulatory Visit: Payer: Self-pay | Admitting: Family Medicine

## 2015-05-24 ENCOUNTER — Telehealth: Payer: Self-pay | Admitting: Family Medicine

## 2015-05-24 NOTE — Telephone Encounter (Signed)
Pre Visit letter sent  °

## 2015-06-06 LAB — HM DIABETES EYE EXAM

## 2015-06-11 ENCOUNTER — Telehealth: Payer: Self-pay | Admitting: General Practice

## 2015-06-11 MED ORDER — LEVOTHYROXINE SODIUM 50 MCG PO TABS: 50.0000 ug | ORAL_TABLET | Freq: Every day | ORAL | Status: DC

## 2015-06-11 NOTE — Telephone Encounter (Signed)
Ok to refill Synthroid.  Pt has upcoming appt for CPE- will check labs at that time

## 2015-06-11 NOTE — Telephone Encounter (Signed)
Med filled.  

## 2015-06-11 NOTE — Telephone Encounter (Signed)
Last OV 02-08-15 Per our system TSH has not been drawn since 05/2014.   Synthroid has never been filled by you, please advise?

## 2015-06-13 ENCOUNTER — Telehealth: Payer: Self-pay | Admitting: *Deleted

## 2015-06-13 NOTE — Telephone Encounter (Signed)
Unable to reach patient at time of Pre-Visit Call.  Left message for patient to return call when available.    

## 2015-06-14 ENCOUNTER — Encounter: Payer: Self-pay | Admitting: Family Medicine

## 2015-06-14 ENCOUNTER — Ambulatory Visit (INDEPENDENT_AMBULATORY_CARE_PROVIDER_SITE_OTHER): Payer: Managed Care, Other (non HMO) | Admitting: Family Medicine

## 2015-06-14 VITALS — BP 126/82 | HR 93 | Temp 98.0°F | Resp 16 | Ht 63.0 in | Wt 217.0 lb

## 2015-06-14 DIAGNOSIS — Z0000 Encounter for general adult medical examination without abnormal findings: Secondary | ICD-10-CM | POA: Diagnosis not present

## 2015-06-14 DIAGNOSIS — E119 Type 2 diabetes mellitus without complications: Secondary | ICD-10-CM | POA: Diagnosis not present

## 2015-06-14 LAB — CBC WITH DIFFERENTIAL/PLATELET
BASOS ABS: 0.1 10*3/uL (ref 0.0–0.1)
BASOS PCT: 0.8 % (ref 0.0–3.0)
EOS PCT: 0.9 % (ref 0.0–5.0)
Eosinophils Absolute: 0.1 10*3/uL (ref 0.0–0.7)
HCT: 39.8 % (ref 36.0–46.0)
Hemoglobin: 12.9 g/dL (ref 12.0–15.0)
LYMPHS ABS: 2.8 10*3/uL (ref 0.7–4.0)
Lymphocytes Relative: 36.5 % (ref 12.0–46.0)
MCHC: 32.5 g/dL (ref 30.0–36.0)
MCV: 85.4 fl (ref 78.0–100.0)
MONO ABS: 0.6 10*3/uL (ref 0.1–1.0)
Monocytes Relative: 7.7 % (ref 3.0–12.0)
Neutro Abs: 4.1 10*3/uL (ref 1.4–7.7)
Neutrophils Relative %: 54.1 % (ref 43.0–77.0)
Platelets: 240 10*3/uL (ref 150.0–400.0)
RBC: 4.66 Mil/uL (ref 3.87–5.11)
RDW: 14.1 % (ref 11.5–15.5)
WBC: 7.5 10*3/uL (ref 4.0–10.5)

## 2015-06-14 LAB — HEMOGLOBIN A1C: HEMOGLOBIN A1C: 6.1 % (ref 4.6–6.5)

## 2015-06-14 LAB — BASIC METABOLIC PANEL
BUN: 20 mg/dL (ref 6–23)
CO2: 26 mEq/L (ref 19–32)
Calcium: 9.5 mg/dL (ref 8.4–10.5)
Chloride: 105 mEq/L (ref 96–112)
Creatinine, Ser: 0.98 mg/dL (ref 0.40–1.20)
GFR: 73.3 mL/min (ref >60.00)
Glucose, Bld: 99 mg/dL (ref 70–99)
Potassium: 3.6 mEq/L (ref 3.5–5.1)
Sodium: 140 mEq/L (ref 135–145)

## 2015-06-14 LAB — LIPID PANEL
CHOLESTEROL: 123 mg/dL (ref 0–200)
HDL: 55.9 mg/dL (ref >39.00)
LDL Cholesterol: 57 mg/dL (ref 0–99)
NonHDL: 67.1
TRIGLYCERIDES: 49 mg/dL (ref 0.0–149.0)
Total CHOL/HDL Ratio: 2
VLDL: 9.8 mg/dL (ref 0.0–40.0)

## 2015-06-14 LAB — HEPATIC FUNCTION PANEL
ALK PHOS: 56 U/L (ref 39–117)
ALT: 19 U/L (ref 0–35)
AST: 15 U/L (ref 0–37)
Albumin: 4 g/dL (ref 3.5–5.2)
Bilirubin, Direct: 0.1 mg/dL (ref 0.0–0.3)
Total Bilirubin: 0.5 mg/dL (ref 0.2–1.2)
Total Protein: 7.5 g/dL (ref 6.0–8.3)

## 2015-06-14 LAB — VITAMIN D 25 HYDROXY (VIT D DEFICIENCY, FRACTURES): VITD: 40.23 ng/mL (ref 30.00–100.00)

## 2015-06-14 LAB — TSH: TSH: 1.64 u[IU]/mL (ref 0.35–4.50)

## 2015-06-14 MED ORDER — POTASSIUM CHLORIDE CRYS ER 20 MEQ PO TBCR: 20.0000 meq | EXTENDED_RELEASE_TABLET | Freq: Every day | ORAL | Status: DC

## 2015-06-14 NOTE — Assessment & Plan Note (Signed)
Pt's PE unchanged from previous.  UTD on GYN, eye exam, colonoscopy.  Check labs.  Anticipatory guidance provided.

## 2015-06-14 NOTE — Patient Instructions (Signed)
Follow up in 3-4 months to recheck diabetes We'll notify you of your lab results and make any changes if needed Keep up the good work on healthy diet and regular exercise Call with any questions or concerns Happy 4th of July!!

## 2015-06-14 NOTE — Assessment & Plan Note (Signed)
Check A1C.  Start meds prn.  UTD on eye exam.  On ARB for renal protection.  Stressed need for low carb diet and regular exercise.  Will follow.

## 2015-06-14 NOTE — Progress Notes (Signed)
Pre visit review using our clinic review tool, if applicable. No additional management support is needed unless otherwise documented below in the visit note. 

## 2015-06-14 NOTE — Progress Notes (Signed)
   Subjective:    Patient ID: Priscilla Thompson, female    DOB: Jul 14, 1951, 64 y.o.   MRN: [id number]  HPI CPE- UTD on GYN (Grewal).  UTD on colonoscopy 2014.  Exercising regularly.  UTD on eye exam.   Review of Systems Patient reports no vision/ hearing changes, adenopathy,fever, weight change,  persistant/recurrent hoarseness , swallowing issues, chest pain, palpitations, edema, persistant/recurrent cough, hemoptysis, dyspnea (rest/exertional/paroxysmal nocturnal), gastrointestinal bleeding (melena, rectal bleeding), abdominal pain, significant heartburn, bowel changes, GU symptoms (dysuria, hematuria, incontinence), Gyn symptoms (abnormal  bleeding, pain),  syncope, focal weakness, memory loss, numbness & tingling, skin/hair/nail changes, abnormal bruising or bleeding, anxiety, or depression.     Objective:   Physical Exam General Appearance:    Alert, cooperative, no distress, appears stated age, obese  Head:    Normocephalic, without obvious abnormality, atraumatic  Eyes:    PERRL, conjunctiva/corneas clear, EOM's intact, fundi    benign, both eyes  Ears:    Normal TM's and external ear canals, both ears  Nose:   Nares normal, septum midline, mucosa normal, no drainage    or sinus tenderness  Throat:   Lips, mucosa, and tongue normal; teeth and gums normal  Neck:   Supple, symmetrical, trachea midline, no adenopathy;    Thyroid: no enlargement/tenderness/nodules  Back:     Symmetric, no curvature, ROM normal, no CVA tenderness  Lungs:     Clear to auscultation bilaterally, respirations unlabored  Chest Wall:    No tenderness or deformity   Heart:    Regular rate and rhythm, S1 and S2 normal, no murmur, rub   or gallop  Breast Exam:    Deferred to GYN  Abdomen:     Soft, non-tender, bowel sounds active all four quadrants,    no masses, no organomegaly  Genitalia:    Deferred to GYN  Rectal:    Extremities:   Extremities normal, atraumatic, no cyanosis or edema  Pulses:   2+ and  symmetric all extremities  Skin:   Skin color, texture, turgor normal, no rashes or lesions  Lymph nodes:   Cervical, supraclavicular, and axillary nodes normal  Neurologic:   CNII-XII intact, normal strength, sensation and reflexes    throughout          Assessment & Plan:

## 2015-08-06 ENCOUNTER — Other Ambulatory Visit: Payer: Self-pay | Admitting: General Practice

## 2015-08-06 MED ORDER — ATORVASTATIN CALCIUM 20 MG PO TABS: 20.0000 mg | ORAL_TABLET | Freq: Every day | ORAL | Status: DC

## 2015-09-23 ENCOUNTER — Telehealth: Payer: Self-pay | Admitting: *Deleted

## 2015-09-23 ENCOUNTER — Encounter: Payer: Self-pay | Admitting: Family Medicine

## 2015-09-23 ENCOUNTER — Ambulatory Visit (INDEPENDENT_AMBULATORY_CARE_PROVIDER_SITE_OTHER): Payer: Managed Care, Other (non HMO) | Admitting: Family Medicine

## 2015-09-23 VITALS — BP 132/90 | HR 83 | Temp 98.0°F | Resp 16 | Ht 63.0 in | Wt 220.2 lb

## 2015-09-23 DIAGNOSIS — E119 Type 2 diabetes mellitus without complications: Secondary | ICD-10-CM | POA: Diagnosis not present

## 2015-09-23 DIAGNOSIS — Z23 Encounter for immunization: Secondary | ICD-10-CM

## 2015-09-23 LAB — BASIC METABOLIC PANEL
BUN: 14 mg/dL (ref 6–23)
CALCIUM: 9.6 mg/dL (ref 8.4–10.5)
CHLORIDE: 102 meq/L (ref 96–112)
CO2: 30 mEq/L (ref 19–32)
Creatinine, Ser: 0.88 mg/dL (ref 0.40–1.20)
GFR: 82.92 mL/min (ref >60.00)
GLUCOSE: 97 mg/dL (ref 70–99)
POTASSIUM: 3.6 meq/L (ref 3.5–5.1)
Sodium: 139 mEq/L (ref 135–145)

## 2015-09-23 LAB — HEMOGLOBIN A1C: HEMOGLOBIN A1C: 6.2 % (ref 4.6–6.5)

## 2015-09-23 NOTE — Progress Notes (Signed)
Pre visit review using our clinic review tool, if applicable. No additional management support is needed unless otherwise documented below in the visit note/SLS  

## 2015-09-23 NOTE — Patient Instructions (Signed)
Follow up in 3-4 months to recheck diabetes, blood pressure, cholesterol We'll notify you of your lab results and make any changes if needed Continue to work on healthy diet and regular exercise- you can do it! Call with any questions or concerns If you want to join [place] at the new [place] office, any scheduled appointments will automatically transfer and we will see you at [address] [place] [address] [place], [place] [address]  Happy [place] Ouch!! Happy Fall!!!

## 2015-09-23 NOTE — Telephone Encounter (Signed)
Copy of Wellness form mailed to patient as promised; successfully faxed to [phone], Original sent to be scanned in pt chart/SLS

## 2015-09-23 NOTE — Progress Notes (Signed)
   Subjective:    Patient ID: Priscilla Thompson, female    DOB: [birth year], 64 y.o.   MRN: [id number]  HPI DM- diet controlled.  UTD on eye exam, on ACE for renal protection.  Due for foot exam.  Denies CP, SOB, HAs, visual changes, edema, abd pain, N/V, numbness/tingling of hands/feet.   Review of Systems For ROS see HPI     Objective:   Physical Exam  Constitutional: She is oriented to person, place, and time. She appears well-developed and well-nourished. No distress.  HENT:  Head: Normocephalic and atraumatic.  Eyes: Conjunctivae and EOM are normal. Pupils are equal, round, and reactive to light.  Neck: Normal range of motion. Neck supple. No thyromegaly present.  Cardiovascular: Normal rate, regular rhythm, normal heart sounds and intact distal pulses.   No murmur heard. Pulmonary/Chest: Effort normal and breath sounds normal. No respiratory distress.  Abdominal: Soft. She exhibits no distension. There is no tenderness.  Musculoskeletal: She exhibits no edema.  Lymphadenopathy:    She has no cervical adenopathy.  Neurological: She is alert and oriented to person, place, and time.  Skin: Skin is warm and dry.  Psychiatric: She has a normal mood and affect. Her behavior is normal.  Vitals reviewed.         Assessment & Plan:

## 2015-09-23 NOTE — Assessment & Plan Note (Signed)
Chronic problem.  Pt has gained a few lbs since last visit- again discussed the need for healthy, low carb diet and regular exercise.  UTD on eye exam.  Foot exam done today.  On ACE for renal protection.  Check labs.  Will start meds prn.  Will continue to follow closely.

## 2015-11-04 ENCOUNTER — Other Ambulatory Visit: Payer: Self-pay | Admitting: General Practice

## 2015-11-04 ENCOUNTER — Other Ambulatory Visit: Payer: Self-pay | Admitting: Family Medicine

## 2015-11-04 MED ORDER — ATORVASTATIN CALCIUM 20 MG PO TABS: 20.0000 mg | ORAL_TABLET | Freq: Every day | ORAL | Status: DC

## 2015-11-04 NOTE — Telephone Encounter (Signed)
Medication filled to pharmacy as requested.   

## 2015-11-10 ENCOUNTER — Other Ambulatory Visit: Payer: Self-pay | Admitting: Family Medicine

## 2015-11-11 NOTE — Telephone Encounter (Signed)
Medication filled to pharmacy as requested.   

## 2016-01-20 ENCOUNTER — Ambulatory Visit (INDEPENDENT_AMBULATORY_CARE_PROVIDER_SITE_OTHER): Payer: Managed Care, Other (non HMO) | Admitting: Medical

## 2016-01-20 ENCOUNTER — Encounter: Payer: Self-pay | Admitting: Medical

## 2016-01-20 VITALS — BP 134/86 | HR 88 | Temp 98.1°F | Ht 63.0 in | Wt 223.0 lb

## 2016-01-20 DIAGNOSIS — S46812A Strain of other muscles, fascia and tendons at shoulder and upper arm level, left arm, initial encounter: Secondary | ICD-10-CM

## 2016-01-20 DIAGNOSIS — M25512 Pain in left shoulder: Secondary | ICD-10-CM | POA: Diagnosis not present

## 2016-01-20 MED ORDER — DICLOFENAC SODIUM 50 MG PO TBEC: 50.0000 mg | DELAYED_RELEASE_TABLET | Freq: Two times a day (BID) | ORAL | Status: DC

## 2016-01-20 MED ORDER — CYCLOBENZAPRINE HCL 5 MG PO TABS: ORAL_TABLET | ORAL | Status: DC

## 2016-01-20 NOTE — Progress Notes (Signed)
Pre visit review using our clinic review tool, if applicable. No additional management support is needed unless otherwise documented below in the visit note. 

## 2016-01-20 NOTE — Patient Instructions (Addendum)
For trapezius strain I prescribed diclofenac. No otc nsaids while using diclofenac.  To relax the muscle prescription of flexeril to use at night.  I would recommend warm compresses twice daily.  If you have any constant mid neck pain then would recommend cspine xray.  For shoulder myalgia(point tender area) above should help as well. If any worsening shoulder pain get xray.  Follow up as regularly scheduled with pcp this Friday or as needed.

## 2016-01-20 NOTE — Progress Notes (Signed)
Subjective:    Patient ID: Priscilla Thompson, female    DOB: 1951/03/27, 65 y.o.   MRN: [id number]  HPI  Pt in with some pain on left side or her neck. Pain also toward left shoulder and toward her left tricep area. Pt states pain since last Monday or Tuesday. Pt has been putting cold and warm compresses.   Pain some mid c area she thought at work but then resolved. No pain that seems to radiates to her arm.  If turn head to the left has pain down to her left trapezius area.   Pt also tried therma-care patch and icy hot tinge unit.  Pt denies any jaw pain or chest pain.  Pt takes arthritis pain medicine [employer] which has tylenol 650 mg  Pain level 6-7/10. Pt thinks may have strained lifting bottle of water.(Case of water0   Review of Systems  Constitutional: Negative for fever, chills and fatigue.  Respiratory: Negative for chest tightness, shortness of breath and wheezing.   Cardiovascular: Negative for chest pain and palpitations.  Gastrointestinal: Negative for abdominal pain.  Musculoskeletal:       See hpi.  Neurological: Negative for dizziness, speech difficulty, weakness, numbness and headaches.  Psychiatric/Behavioral: Negative for behavioral problems and confusion.    Past Medical History  Diagnosis Date  . Diabetes mellitus   . Thyroid disease   . Hypertension   . Hypothyroid   . Osteoarthritis   . Hyperlipidemia     Social History   Social History  . Marital Status: Single    Spouse Name: N/A  . Number of Children: N/A  . Years of Education: N/A   Occupational History  . Not on file.   Social History Main Topics  . Smoking status: Never Smoker   . Smokeless tobacco: Not on file  . Alcohol Use: Yes     Comment: socially  . Drug Use: Not on file  . Sexual Activity: Not on file   Other Topics Concern  . Not on file   Social History Narrative    No past surgical history on file.  Family History  Problem Relation Age of Onset  .  Diabetes Sister   . Diabetes Brother   . Cancer Brother     liver, lung    No Known Allergies  Current Outpatient Prescriptions on File Prior to Visit  Medication Sig Dispense Refill  . acetaminophen (TYLENOL) 650 MG CR tablet Take 1,300 mg by mouth every 8 (eight) hours as needed for pain (for arthritis).    Marland Kitchen amLODipine (NORVASC) 5 MG tablet Take 5 mg by mouth daily.     Marland Kitchen aspirin 81 MG tablet Take 81 mg by mouth daily.    Marland Kitchen atorvastatin (LIPITOR) 20 MG tablet Take 1 tablet (20 mg total) by mouth daily. 90 tablet 1  . Biotin 2500 MCG CAPS Take 1 capsule by mouth daily.    . Calcium Carb-Cholecalciferol (CALCIUM-VITAMIN D) 500-400 MG-UNIT TABS Take 1 tablet by mouth daily.    . Cholecalciferol (VITAMIN D3) 1000 UNITS CAPS Take 1 capsule by mouth daily.    . fish oil-omega-3 fatty acids 1000 MG capsule Take 1,200 mg by mouth 3 (three) times daily.     Marland Kitchen KLOR-CON M20 20 MEQ tablet TAKE 1 TABLET DAILY 90 tablet 1  . latanoprost (XALATAN) 0.005 % ophthalmic solution Place 1 drop into both eyes at bedtime.     Marland Kitchen lisinopril-hydrochlorothiazide (PRINZIDE,ZESTORETIC) 20-25 MG per tablet Take 1 tablet by  mouth daily.     . Multiple Vitamins-Minerals (MULTI FOR HER 50+ PO) Take 2 chew by mouth daily    . omeprazole (PRILOSEC) 20 MG capsule Take 20 mg by mouth daily.    Marland Kitchen SYNTHROID 50 MCG tablet TAKE 1 TABLET DAILY 90 tablet 1   No current facility-administered medications on file prior to visit.    BP 134/86 mmHg  Pulse 88  Temp(Src) 98.1 F (36.7 C) (Oral)  Ht  (1.6 m)  Wt 223 lb (101.152 kg)  BMI 39.51 kg/m2  SpO2 98%       Objective:   Physical Exam  General- No acute distress. Pleasant patient. Neck- Full range of motion, no jvd no mid c spine tenderness on exam. Turning head to the left will increase left trapezius pain.  Lungs- Clear, even and unlabored. Heart- regular rate and rhythm. Neurologic- CNII- XII grossly intact.  Lt shoulder- no pain on rom.But posterior  shoulder area direct to palpation. Skin- pt has no rash on her skin.        Assessment & Plan:  Pt declined offer for toradol IM.  For trapezius strain I prescribed diclofenac. No otc nsaids while using diclofenac.  To relax the muscle prescription of flexeril to use at night.  I would recommend warm compresses twice daily.  If you have any constant mid neck pain then would recommend cspine xray.  For shoulder myalgia(point tender area) above should help as well. If any worsening shoulder pain get xray.  Pt was educated on if any rash occurs/vesicles let us know immediatley.  Also since left side pain trapezius and shoulder explained if any chest pain or jaw pain occuring with then ED evaluation.

## 2016-01-24 ENCOUNTER — Ambulatory Visit (INDEPENDENT_AMBULATORY_CARE_PROVIDER_SITE_OTHER): Payer: Managed Care, Other (non HMO) | Admitting: Family Medicine

## 2016-01-24 ENCOUNTER — Encounter: Payer: Self-pay | Admitting: Family Medicine

## 2016-01-24 VITALS — BP 136/82 | HR 86 | Temp 97.8°F | Ht 63.0 in | Wt 222.2 lb

## 2016-01-24 DIAGNOSIS — E038 Other specified hypothyroidism: Secondary | ICD-10-CM

## 2016-01-24 DIAGNOSIS — E785 Hyperlipidemia, unspecified: Secondary | ICD-10-CM

## 2016-01-24 DIAGNOSIS — I10 Essential (primary) hypertension: Secondary | ICD-10-CM | POA: Diagnosis not present

## 2016-01-24 DIAGNOSIS — E119 Type 2 diabetes mellitus without complications: Secondary | ICD-10-CM

## 2016-01-24 LAB — LIPID PANEL
CHOL/HDL RATIO: 2
Cholesterol: 136 mg/dL (ref 0–200)
HDL: 60.4 mg/dL (ref >39.00)
LDL Cholesterol: 66 mg/dL (ref 0–99)
NonHDL: 75.77
TRIGLYCERIDES: 51 mg/dL (ref 0.0–149.0)
VLDL: 10.2 mg/dL (ref 0.0–40.0)

## 2016-01-24 LAB — HEPATIC FUNCTION PANEL
ALBUMIN: 4.2 g/dL (ref 3.5–5.2)
ALT: 30 U/L (ref 0–35)
AST: 20 U/L (ref 0–37)
Alkaline Phosphatase: 57 U/L (ref 39–117)
Bilirubin, Direct: 0.1 mg/dL (ref 0.0–0.3)
Total Bilirubin: 0.5 mg/dL (ref 0.2–1.2)
Total Protein: 7.6 g/dL (ref 6.0–8.3)

## 2016-01-24 LAB — BASIC METABOLIC PANEL
BUN: 13 mg/dL (ref 6–23)
CHLORIDE: 103 meq/L (ref 96–112)
CO2: 31 meq/L (ref 19–32)
CREATININE: 0.84 mg/dL (ref 0.40–1.20)
Calcium: 9.4 mg/dL (ref 8.4–10.5)
GFR: 87.4 mL/min (ref >60.00)
Glucose, Bld: 92 mg/dL (ref 70–99)
Potassium: 3.8 mEq/L (ref 3.5–5.1)
Sodium: 140 mEq/L (ref 135–145)

## 2016-01-24 LAB — HEMOGLOBIN A1C: Hgb A1c MFr Bld: 6.4 % (ref 4.6–6.5)

## 2016-01-24 LAB — TSH: TSH: 1.94 u[IU]/mL (ref 0.35–4.50)

## 2016-01-24 MED ORDER — CYCLOBENZAPRINE HCL 5 MG PO TABS: ORAL_TABLET | ORAL | Status: DC

## 2016-01-24 MED ORDER — LISINOPRIL-HYDROCHLOROTHIAZIDE 20-25 MG PO TABS: 1.0000 | ORAL_TABLET | Freq: Every day | ORAL | Status: DC

## 2016-01-24 MED ORDER — AMLODIPINE BESYLATE 5 MG PO TABS
5.0000 mg | ORAL_TABLET | Freq: Every day | ORAL | Status: DC
Start: 2016-01-24 — End: 2016-08-20

## 2016-01-24 NOTE — Progress Notes (Signed)
Pre visit review using our clinic review tool, if applicable. No additional management support is needed unless otherwise documented below in the visit note. 

## 2016-01-24 NOTE — Assessment & Plan Note (Signed)
Chronic problem.  Currently asymptomatic.  Check labs.  Adjust meds prn  

## 2016-01-24 NOTE — Assessment & Plan Note (Signed)
Chronic problem.  Adequate control.  Asymptomatic.  Check labs.  No anticipated med changes 

## 2016-01-24 NOTE — Progress Notes (Signed)
   Subjective:    Patient ID: Priscilla Thompson, female    DOB: Feb 28, 1951, 65 y.o.   MRN: [id number]  HPI HTN- chronic problem, adequate control on Lisinopril-HCTZ and Amlodipine.  Denies CP, SOB, HAs, visual changes, edema.  Hyperlipidemia- chronic problem, on Lipitor and fish oil daily.  Denies abd pain, N/V, myalgias (with exception of back pain)  DM- chronic problem, diet controlled.  On ACE for renal protection.  UTD on foot exam, eye exam.  Watching labels for carbs.  Denies symptomatic lows, no numbness/tingling of hands/feet.  Hypothyroid- chronic problem, on Synthroid   Review of Systems For ROS see HPI     Objective:   Physical Exam  Constitutional: She is oriented to person, place, and time. She appears well-developed and well-nourished. No distress.  HENT:  Head: Normocephalic and atraumatic.  Eyes: Conjunctivae and EOM are normal. Pupils are equal, round, and reactive to light.  Neck: Normal range of motion. Neck supple. No thyromegaly present.  Cardiovascular: Normal rate, regular rhythm, normal heart sounds and intact distal pulses.   No murmur heard. Pulmonary/Chest: Effort normal and breath sounds normal. No respiratory distress.  Abdominal: Soft. She exhibits no distension. There is no tenderness.  Musculoskeletal: She exhibits no edema.  Lymphadenopathy:    She has no cervical adenopathy.  Neurological: She is alert and oriented to person, place, and time.  Skin: Skin is warm and dry.  Psychiatric: She has a normal mood and affect. Her behavior is normal.  Vitals reviewed.         Assessment & Plan:

## 2016-01-24 NOTE — Assessment & Plan Note (Signed)
Chronic problem.  Currently diet controlled but pt admits to slacking on healthy diet and regular exercise.  UTD on eye exam and foot exam.  On ACE for renal protection.  Check labs.  Adjust tx plan prn.

## 2016-01-24 NOTE — Assessment & Plan Note (Signed)
Chronic problem.  Tolerating statin w/o difficulty.  Check labs.  Adjust meds prn  

## 2016-01-24 NOTE — Patient Instructions (Signed)
Follow up in 3-4 months to recheck sugars We'll notify you of your lab results and make any changes if needed Keep up the good work!  You look great! Continue the anti-inflammatory twice daily- take w/ food Use the Flexeril for nights and weekends- will cause drowsiness Call with any questions or concerns Happy Valentine's Day!!!

## 2016-01-25 LAB — CBC WITH DIFFERENTIAL/PLATELET
BASOS ABS: 0 10*3/uL (ref 0.0–0.1)
Basophils Relative: 0.3 % (ref 0.0–3.0)
EOS ABS: 0.2 10*3/uL (ref 0.0–0.7)
Eosinophils Relative: 3.6 % (ref 0.0–5.0)
HCT: 40.8 % (ref 36.0–46.0)
HEMOGLOBIN: 12.7 g/dL (ref 12.0–15.0)
Lymphocytes Relative: 34.1 % (ref 12.0–46.0)
Lymphs Abs: 2.3 10*3/uL (ref 0.7–4.0)
MCHC: 31.1 g/dL (ref 30.0–36.0)
MCV: 89.9 fl (ref 78.0–100.0)
MONO ABS: 0.6 10*3/uL (ref 0.1–1.0)
Monocytes Relative: 8.7 % (ref 3.0–12.0)
Neutro Abs: 3.6 10*3/uL (ref 1.4–7.7)
Neutrophils Relative %: 53.3 % (ref 43.0–77.0)
Platelets: 255 10*3/uL (ref 150.0–400.0)
RBC: 4.54 Mil/uL (ref 3.87–5.11)
RDW: 14.6 % (ref 11.5–15.5)
WBC: 6.7 10*3/uL (ref 4.0–10.5)

## 2016-02-07 ENCOUNTER — Ambulatory Visit (INDEPENDENT_AMBULATORY_CARE_PROVIDER_SITE_OTHER): Payer: Managed Care, Other (non HMO) | Admitting: Medical

## 2016-02-07 ENCOUNTER — Ambulatory Visit (HOSPITAL_BASED_OUTPATIENT_CLINIC_OR_DEPARTMENT_OTHER)
Admission: RE | Admit: 2016-02-07 | Discharge: 2016-02-07 | Disposition: A | Discharge status: Home / Self Care | Payer: Managed Care, Other (non HMO) | Source: Ambulatory Visit | Attending: Medical | Admitting: Medical

## 2016-02-07 ENCOUNTER — Other Ambulatory Visit: Payer: Self-pay | Admitting: Medical

## 2016-02-07 ENCOUNTER — Encounter: Payer: Self-pay | Admitting: Medical

## 2016-02-07 VITALS — BP 130/86 | HR 78 | Temp 97.9°F | Ht 63.0 in | Wt 224.6 lb

## 2016-02-07 DIAGNOSIS — M542 Cervicalgia: Secondary | ICD-10-CM

## 2016-02-07 DIAGNOSIS — S46812D Strain of other muscles, fascia and tendons at shoulder and upper arm level, left arm, subsequent encounter: Secondary | ICD-10-CM | POA: Diagnosis not present

## 2016-02-07 DIAGNOSIS — X500XXD Overexertion from strenuous movement or load, subsequent encounter: Secondary | ICD-10-CM | POA: Diagnosis not present

## 2016-02-07 DIAGNOSIS — M25512 Pain in left shoulder: Secondary | ICD-10-CM

## 2016-02-07 MED ORDER — DICLOFENAC SODIUM 50 MG PO TBEC: 50.0000 mg | DELAYED_RELEASE_TABLET | Freq: Two times a day (BID) | ORAL | Status: DC

## 2016-02-07 MED ORDER — HYDROCODONE-ACETAMINOPHEN 5-325 MG PO TABS: 1.0000 | ORAL_TABLET | Freq: Four times a day (QID) | ORAL | Status: DC | PRN

## 2016-02-07 MED ORDER — CYCLOBENZAPRINE HCL 5 MG PO TABS: ORAL_TABLET | ORAL | Status: DC

## 2016-02-07 MED ORDER — KETOROLAC TROMETHAMINE 60 MG/2ML IM SOLN
60.0000 mg | Freq: Once | INTRAMUSCULAR | Status: AC
  Administered 2016-02-07: 60 mg via INTRAMUSCULAR

## 2016-02-07 NOTE — Progress Notes (Signed)
Pre visit review using our clinic review tool, if applicable. No additional management support is needed unless otherwise documented below in the visit note. 

## 2016-02-07 NOTE — Addendum Note (Signed)
Addended by: Neldon Labella on: 02/07/2016 03:20 PM   Modules accepted: Orders

## 2016-02-07 NOTE — Patient Instructions (Addendum)
For your neck and shoulder pain we gave toradol 60 mg injection today.  Start diclofenac tomorrow. Can use cyclobenzaprine at night.  For severe pain norco(stop any tylenol while on norco)  Get xray of cspine and shoulder today.  Will go ahead and refer to sports medicine since this pain is present for about 3 weeks.   Trying to get you in with sports med next week

## 2016-02-07 NOTE — Progress Notes (Signed)
Subjective:    Patient ID: Priscilla Thompson, female    DOB: 1951-02-11, 65 y.o.   MRN: [id number]  HPI  Pt in for follow up. Pt states that she has some pain in her left posterior shoulder pain and some pain in her left trapezius.   Pain level is severe. Worse when she turns her head.  Last time I saw her I rx'd diclofenac and flexeril. Pt states ran out of diclofenac but she still has flexeril. For a while when on diclofenac felt better but when ran out pain increased.  Pt has tried alleve but pain is still present.   No chest pain.No sob.  Pt states pain present now for about 3 wks.    Review of Systems  Constitutional: Negative for fever, chills and fatigue.  Respiratory: Negative for cough, chest tightness, shortness of breath and wheezing.   Cardiovascular: Negative for chest pain and palpitations.  Gastrointestinal: Negative for abdominal pain.  Musculoskeletal: Positive for neck pain.       Lt shoulder pain  Neurological: Negative for dizziness and headaches.  Hematological: Negative for adenopathy. Does not bruise/bleed easily.  Psychiatric/Behavioral: Negative for behavioral problems and confusion.    Past Medical History  Diagnosis Date  . Diabetes mellitus   . Thyroid disease   . Hypertension   . Hypothyroid   . Osteoarthritis   . Hyperlipidemia     Social History   Social History  . Marital Status: Single    Spouse Name: N/A  . Number of Children: N/A  . Years of Education: N/A   Occupational History  . Not on file.   Social History Main Topics  . Smoking status: Never Smoker   . Smokeless tobacco: Not on file  . Alcohol Use: Yes     Comment: socially  . Drug Use: Not on file  . Sexual Activity: Not on file   Other Topics Concern  . Not on file   Social History Narrative    No past surgical history on file.  Family History  Problem Relation Age of Onset  . Diabetes Sister   . Diabetes Brother   . Cancer Brother     liver, lung      No Known Allergies  Current Outpatient Prescriptions on File Prior to Visit  Medication Sig Dispense Refill  . acetaminophen (TYLENOL) 650 MG CR tablet Take 1,300 mg by mouth every 8 (eight) hours as needed for pain (for arthritis).    Marland Kitchen amLODipine (NORVASC) 5 MG tablet Take 1 tablet (5 mg total) by mouth daily. 90 tablet 1  . aspirin 81 MG tablet Take 81 mg by mouth daily.    Marland Kitchen atorvastatin (LIPITOR) 20 MG tablet Take 1 tablet (20 mg total) by mouth daily. 90 tablet 1  . Biotin 2500 MCG CAPS Take 1 capsule by mouth daily.    . Calcium Carb-Cholecalciferol (CALCIUM-VITAMIN D) 500-400 MG-UNIT TABS Take 1 tablet by mouth daily.    . Cholecalciferol (VITAMIN D3) 1000 UNITS CAPS Take 1 capsule by mouth daily.    . cyclobenzaprine (FLEXERIL) 5 MG tablet 1 tab po q hs as needed trapezius pain or muscle spasma. 30 tablet 0  . diclofenac (VOLTAREN) 50 MG EC tablet Take 1 tablet (50 mg total) by mouth 2 (two) times daily. 20 tablet 0  . fish oil-omega-3 fatty acids 1000 MG capsule Take 1,200 mg by mouth 3 (three) times daily.     Marland Kitchen KLOR-CON M20 20 MEQ tablet TAKE 1 TABLET  DAILY 90 tablet 1  . latanoprost (XALATAN) 0.005 % ophthalmic solution Place 1 drop into both eyes at bedtime.     Marland Kitchen lisinopril-hydrochlorothiazide (PRINZIDE,ZESTORETIC) 20-25 MG tablet Take 1 tablet by mouth daily. 90 tablet 1  . Multiple Vitamins-Minerals (MULTI FOR HER 50+ PO) Take 2 chew by mouth daily    . omeprazole (PRILOSEC) 20 MG capsule Take 20 mg by mouth daily.    Marland Kitchen SYNTHROID 50 MCG tablet TAKE 1 TABLET DAILY 90 tablet 1   No current facility-administered medications on file prior to visit.    BP 130/86 mmHg  Pulse 78  Temp(Src) 97.9 F (36.6 C) (Oral)  Ht  (1.6 m)  Wt 224 lb 9.6 oz (101.878 kg)  BMI 39.80 kg/m2  SpO2 97%       Objective:   Physical Exam  General- No acute distress. Pleasant patient. Neck- Full range of motion, no jvd no mid c spine tenderness on exam. Turning head to the left  will increase left trapezius pain.  Lungs- Clear, even and unlabored. Heart- regular rate and rhythm. Neurologic- CNII- XII grossly intact.  Lt shoulder- mild  pain on abuduction.But posterior shoulder area direct to palpation. Skin- pt has no rash on her skin.  .     Assessment & Plan:  For your neck and shoulder pain we gave toradol 60 mg injection today.  Start diclofenac tomorrow. Can use cyclobenzaprine at night.  For severe pain norco(stop any tylenol while on norco)  Get xray of cspine and shoulder today.  Will go ahead and refer to sports medicine since this pain is present for about 3 weeks.   Trying to get you in with sports med next week

## 2016-02-10 NOTE — Progress Notes (Signed)
Quick Note:  Pt has seen results on MyChart and message also sent for patient to call back if any questions. ______ 

## 2016-02-11 ENCOUNTER — Ambulatory Visit: Payer: Managed Care, Other (non HMO) | Admitting: Family Medicine

## 2016-02-11 ENCOUNTER — Ambulatory Visit (INDEPENDENT_AMBULATORY_CARE_PROVIDER_SITE_OTHER): Payer: Managed Care, Other (non HMO) | Admitting: Family Medicine

## 2016-02-11 ENCOUNTER — Encounter: Payer: Self-pay | Admitting: Family Medicine

## 2016-02-11 VITALS — BP 130/82 | HR 105 | Ht 63.0 in | Wt 224.0 lb

## 2016-02-11 DIAGNOSIS — S161XXA Strain of muscle, fascia and tendon at neck level, initial encounter: Secondary | ICD-10-CM | POA: Diagnosis not present

## 2016-02-11 LAB — HM MAMMOGRAPHY

## 2016-02-11 NOTE — Patient Instructions (Signed)
You have a cervical strain. Continue with voltaren as needed at this point for pain and inflammation. Flexeril three times a day as needed for muscle spasms (can make you sleepy - if so do not drive while taking this). Norco as needed for severe pain (no driving on this medicine). Simple range of motion exercises within limits of pain to prevent further stiffness. Consider physical therapy for stretching, exercises, traction, and modalities if you don't continue to improve - call me if you want to do this. Heat 15 minutes at a time 3-4 times a day to help with spasms. Watch head position when on computers, texting, when sleeping in bed - should in line with back to prevent further muscle strain. If not improving we will consider an MRI. Follow up with me in 1 month or as needed

## 2016-02-13 DIAGNOSIS — S161XXA Strain of muscle, fascia and tendon at neck level, initial encounter: Secondary | ICD-10-CM | POA: Insufficient documentation

## 2016-02-13 NOTE — Progress Notes (Signed)
PCP: Neena Rhymes, MD  Referral requested by Esperanza Richters PA  Subjective:   HPI: Patient is a 65 y.o. female here for neck pain.  Patient reports she recalls picking up a case of bottled water at the store 3-4 weeks ago and started to get left sided neck pain. Neck feels stiff. Pain radiates to upper arm. Pain level 1/10 now, dull. No numbness or tingling. No night pain. Tried flexeril, norco, toradol, diclofenac which have helped. Works on [employer] all day. No skin changes, fever, other complaints.  Past Medical History  Diagnosis Date  . Diabetes mellitus   . Thyroid disease   . Hypertension   . Hypothyroid   . Osteoarthritis   . Hyperlipidemia     Current Outpatient Prescriptions on File Prior to Visit  Medication Sig Dispense Refill  . acetaminophen (TYLENOL) 650 MG CR tablet Take 1,300 mg by mouth every 8 (eight) hours as needed for pain (for arthritis).    Marland Kitchen amLODipine (NORVASC) 5 MG tablet Take 1 tablet (5 mg total) by mouth daily. 90 tablet 1  . aspirin 81 MG tablet Take 81 mg by mouth daily.    Marland Kitchen atorvastatin (LIPITOR) 20 MG tablet Take 1 tablet (20 mg total) by mouth daily. 90 tablet 1  . Biotin 2500 MCG CAPS Take 1 capsule by mouth daily.    . Calcium Carb-Cholecalciferol (CALCIUM-VITAMIN D) 500-400 MG-UNIT TABS Take 1 tablet by mouth daily.    . Cholecalciferol (VITAMIN D3) 1000 UNITS CAPS Take 1 capsule by mouth daily.    . cyclobenzaprine (FLEXERIL) 5 MG tablet 1 tab po q hs as needed trapezius pain or muscle spasma. 30 tablet 0  . diclofenac (VOLTAREN) 50 MG EC tablet Take 1 tablet (50 mg total) by mouth 2 (two) times daily. 20 tablet 0  . fish oil-omega-3 fatty acids 1000 MG capsule Take 1,200 mg by mouth 3 (three) times daily.     Marland Kitchen HYDROcodone-acetaminophen (NORCO) 5-325 MG tablet Take 1 tablet by mouth every 6 (six) hours as needed for moderate pain. 30 tablet 0  . KLOR-CON M20 20 MEQ tablet TAKE 1 TABLET DAILY 90 tablet 1  . latanoprost (XALATAN)  0.005 % ophthalmic solution Place 1 drop into both eyes at bedtime.     Marland Kitchen lisinopril-hydrochlorothiazide (PRINZIDE,ZESTORETIC) 20-25 MG tablet Take 1 tablet by mouth daily. 90 tablet 1  . Multiple Vitamins-Minerals (MULTI FOR HER 50+ PO) Take 2 chew by mouth daily    . omeprazole (PRILOSEC) 20 MG capsule Take 20 mg by mouth daily.    Marland Kitchen SYNTHROID 50 MCG tablet TAKE 1 TABLET DAILY 90 tablet 1   No current facility-administered medications on file prior to visit.    No past surgical history on file.  No Known Allergies  Social History   Social History  . Marital Status: Single    Spouse Name: N/A  . Number of Children: N/A  . Years of Education: N/A   Occupational History  . Not on file.   Social History Main Topics  . Smoking status: Never Smoker   . Smokeless tobacco: Not on file  . Alcohol Use: 0.0 oz/week    0 Standard drinks or equivalent per week     Comment: socially  . Drug Use: Not on file  . Sexual Activity: Not on file   Other Topics Concern  . Not on file   Social History Narrative    Family History  Problem Relation Age of Onset  . Diabetes Sister   .  Diabetes Brother   . Cancer Brother     liver, lung    BP 130/82 mmHg  Pulse 105  Ht  (1.6 m)  Wt 224 lb (101.606 kg)  BMI 39.69 kg/m2  Review of Systems: See HPI above.    Objective:  Physical Exam:  Gen: NAD, comfortable in exam room  Neck: No gross deformity, swelling, bruising. TTP left cervical paraspinal region.  No midline/bony TTP. FROM neck - pain on right lateral rotation. BUE strength 5/5.   Sensation intact to light touch.   2+ equal reflexes in triceps, biceps, brachioradialis tendons. Negative spurlings. NV intact distal BUEs.  Left shoulder: No swelling, ecchymoses.  No gross deformity. No TTP. FROM. Negative Hawkins, Neers. Negative Speeds, Yergasons. Strength 5/5 with empty can and resisted internal/external rotation. Negative apprehension. NV intact  distally.    Assessment & Plan:  1. Cervical strain - improving with diclofenac, flexeril, norco.  Will continue with these.  Start simple motion exercises.  Heat for spasms.  Consider physical therapy if she doesn't continue to improve - call us if she would like to do this.  F/u in 1 month or prn.

## 2016-02-13 NOTE — Assessment & Plan Note (Signed)
improving with diclofenac, flexeril, norco.  Will continue with these.  Start simple motion exercises.  Heat for spasms.  Consider physical therapy if she doesn't continue to improve - call us if she would like to do this.  F/u in 1 month or prn.

## 2016-03-18 ENCOUNTER — Other Ambulatory Visit: Payer: Self-pay | Admitting: Family Medicine

## 2016-03-18 NOTE — Telephone Encounter (Signed)
Medication filled to pharmacy as requested.   

## 2016-03-23 LAB — HM PAP SMEAR

## 2016-04-12 LAB — HM DIABETES EYE EXAM

## 2016-04-14 ENCOUNTER — Other Ambulatory Visit: Payer: Self-pay | Admitting: Family Medicine

## 2016-04-14 NOTE — Telephone Encounter (Signed)
Medication filled to pharmacy as requested.   

## 2016-05-03 IMAGING — DX DG CERVICAL SPINE 2 OR 3 VIEWS
3 series · 3 of 3 positions shown · non-contrast
Comparison: None in PACs

CLINICAL DATA: Lifted container of water in [REDACTED] and has had
persistent left-sided neck pain extending into the left shoulder
with some swelling.

EXAM:
CERVICAL SPINE - 2-3 VIEW

[c-spine lat]
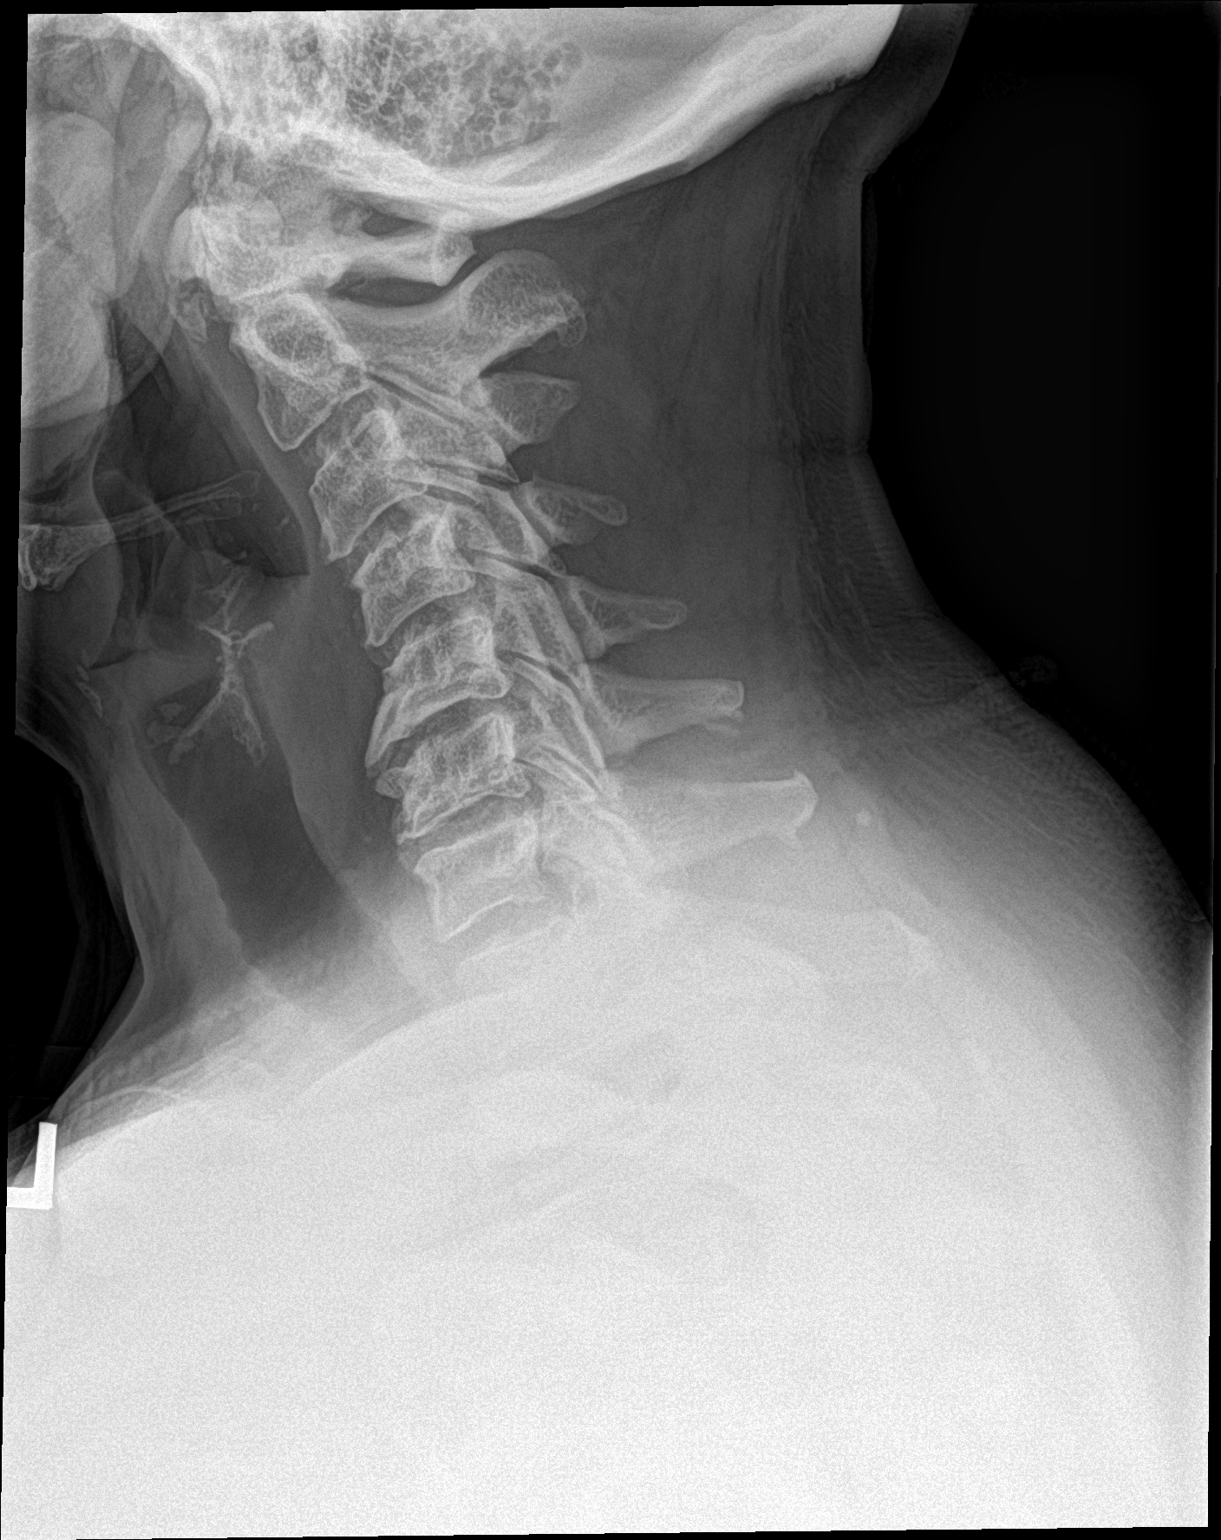

[c-spine ap]
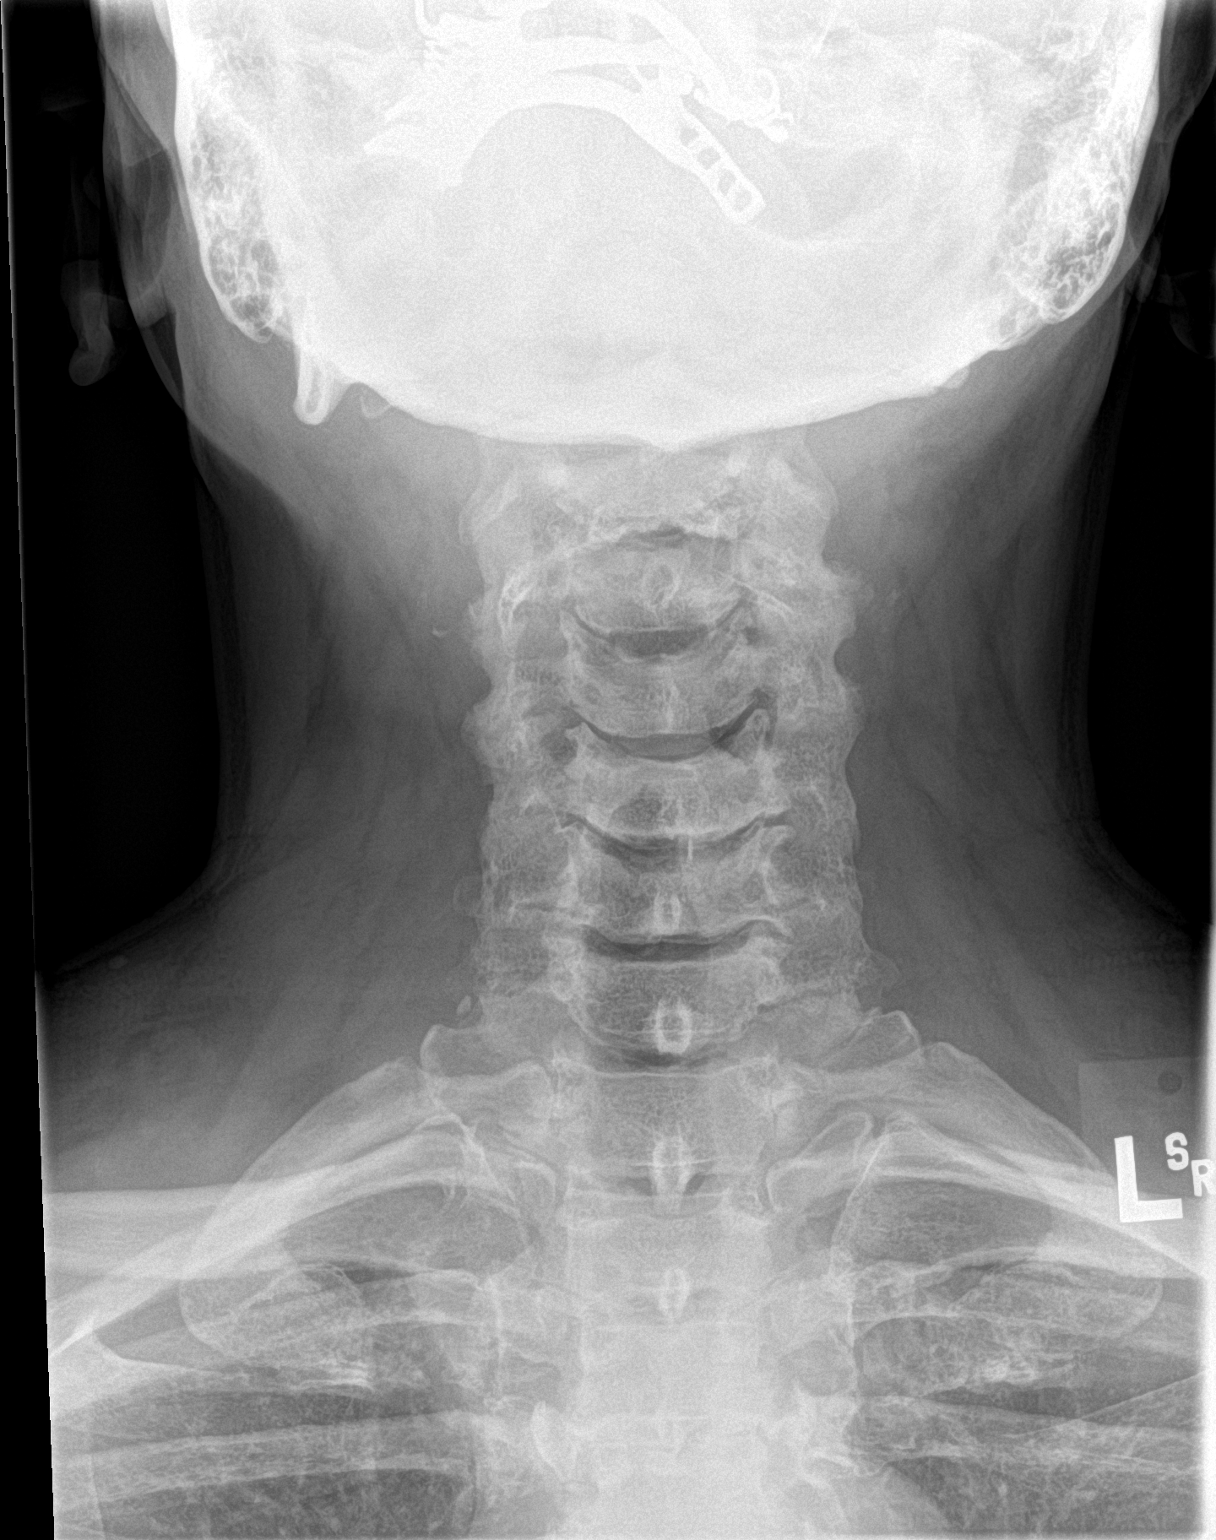

[c-spine open mouth]
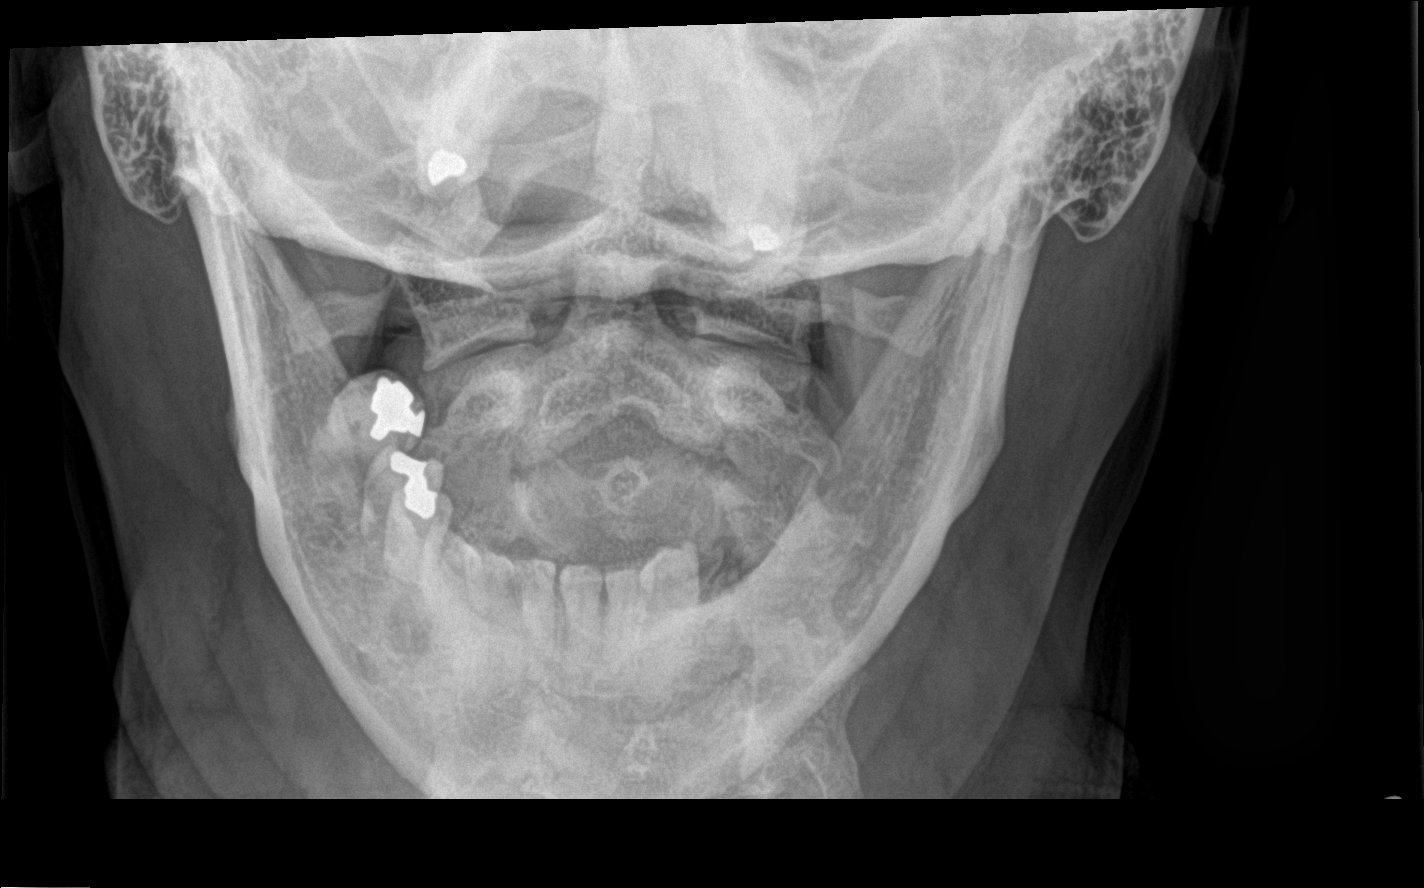

[3 of 3 positions shown; findings below may reference images not displayed]

FINDINGS: There is reversal of the normal cervical lordosis. The cervical
vertebral bodies are preserved in height. There are large anterior
near bridging osteophytes at C5-6 with smaller osteophytes at C6-7.
There is no spondylolisthesis. The facet joints are unremarkable.
The spinous processes are intact. There is mild degenerative change
centered on the atlanto dens articulation. The prevertebral soft
tissue spaces are normal.
IMPRESSION: Reversal of the normal cervical lordosis suggests muscle spasm.
Cervical spine MRI is recommended given the chronic persistent
symptoms with a radicular component.

## 2016-05-13 ENCOUNTER — Encounter: Payer: Self-pay | Admitting: Family Medicine

## 2016-05-13 ENCOUNTER — Ambulatory Visit (INDEPENDENT_AMBULATORY_CARE_PROVIDER_SITE_OTHER): Payer: Managed Care, Other (non HMO) | Admitting: Family Medicine

## 2016-05-13 VITALS — BP 134/80 | HR 76 | Temp 98.1°F | Resp 16 | Ht 63.0 in | Wt 222.1 lb

## 2016-05-13 DIAGNOSIS — E119 Type 2 diabetes mellitus without complications: Secondary | ICD-10-CM | POA: Diagnosis not present

## 2016-05-13 LAB — BASIC METABOLIC PANEL
BUN: 13 mg/dL (ref 6–23)
CALCIUM: 9.6 mg/dL (ref 8.4–10.5)
CO2: 31 mEq/L (ref 19–32)
Chloride: 103 mEq/L (ref 96–112)
Creatinine, Ser: 0.79 mg/dL (ref 0.40–1.20)
GFR: 93.73 mL/min (ref >60.00)
GLUCOSE: 99 mg/dL (ref 70–99)
Potassium: 3.9 mEq/L (ref 3.5–5.1)
Sodium: 140 mEq/L (ref 135–145)

## 2016-05-13 LAB — HEMOGLOBIN A1C: Hgb A1c MFr Bld: 6.6 % — ABNORMAL HIGH (ref 4.6–6.5)

## 2016-05-13 NOTE — Patient Instructions (Signed)
Schedule your complete physical in 3-4 months We'll notify you of your lab results and make any changes if needed Continue to work on healthy diet and regular exercise- you can do it! Call with any questions or concerns Thanks for sticking with us! Happy Retirement!!!

## 2016-05-13 NOTE — Assessment & Plan Note (Signed)
Currently diet controlled.  UTD on eye exam (done last month w/ Dr Lawerance BachBurns).  UTD on foot exam.  On ACE for renal protection.  Currently asymptomatic.  Stressed importance of low carb diet and regular exercise.  Check labs.  Start meds prn.

## 2016-05-13 NOTE — Progress Notes (Signed)
Pre visit review using our clinic review tool, if applicable. No additional management support is needed unless otherwise documented below in the visit note. 

## 2016-05-13 NOTE — Progress Notes (Signed)
   Subjective:    Patient ID: Priscilla Thompson, female    DOB: May 12, 1951, 65 y.o.   MRN: [id number]  HPI DM- chronic problem, attempting to control w/ healthy diet.  Last A1C 6.4  On ACE for renal protection.  UTD on foot exam.  Pt had eye exam w/ Dr Lawerance BachBurns last month- no retinopathy.  Has lost 2 lbs.  Pt is walking regularly.  No CP, SOB, HAs, visual changes, edema, abd pain, N/V, numbness/tingling hands/feet.      Review of Systems For ROS see HPI     Objective:   Physical Exam  Constitutional: She is oriented to person, place, and time. She appears well-developed and well-nourished. No distress.  HENT:  Head: Normocephalic and atraumatic.  Eyes: Conjunctivae and EOM are normal. Pupils are equal, round, and reactive to light.  Neck: Normal range of motion. Neck supple. No thyromegaly present.  Cardiovascular: Normal rate, regular rhythm, normal heart sounds and intact distal pulses.   No murmur heard. Pulmonary/Chest: Effort normal and breath sounds normal. No respiratory distress.  Abdominal: Soft. She exhibits no distension. There is no tenderness.  Musculoskeletal: She exhibits no edema.  Lymphadenopathy:    She has no cervical adenopathy.  Neurological: She is alert and oriented to person, place, and time.  Skin: Skin is warm and dry.  Psychiatric: She has a normal mood and affect. Her behavior is normal.  Vitals reviewed.         Assessment & Plan:

## 2016-05-24 ENCOUNTER — Other Ambulatory Visit: Payer: Self-pay | Admitting: Family Medicine

## 2016-05-25 NOTE — Telephone Encounter (Signed)
Medication filled to pharmacy as requested.   

## 2016-06-23 ENCOUNTER — Encounter: Payer: Self-pay | Admitting: Family Medicine

## 2016-06-24 MED ORDER — DICLOFENAC SODIUM 50 MG PO TBEC: 50.0000 mg | DELAYED_RELEASE_TABLET | Freq: Two times a day (BID) | ORAL | Status: DC

## 2016-06-24 NOTE — Telephone Encounter (Signed)
Last OV 05/13/16 Diclofenac last filled

## 2016-06-24 NOTE — Addendum Note (Signed)
Addended by: Geannie RisenBRODMERKEL, JESSICA L on: 06/24/2016 08:36 AM   Modules accepted: Orders

## 2016-06-24 NOTE — Telephone Encounter (Signed)
02/07/16 

## 2016-08-20 ENCOUNTER — Other Ambulatory Visit: Payer: Self-pay | Admitting: Family Medicine

## 2016-09-10 ENCOUNTER — Encounter: Payer: Self-pay | Admitting: Family Medicine

## 2016-09-10 ENCOUNTER — Ambulatory Visit (INDEPENDENT_AMBULATORY_CARE_PROVIDER_SITE_OTHER): Payer: Managed Care, Other (non HMO) | Admitting: Family Medicine

## 2016-09-10 VITALS — BP 124/82 | HR 72 | Temp 98.2°F | Resp 16 | Ht 63.0 in | Wt 223.5 lb

## 2016-09-10 DIAGNOSIS — E119 Type 2 diabetes mellitus without complications: Secondary | ICD-10-CM

## 2016-09-10 DIAGNOSIS — Z23 Encounter for immunization: Secondary | ICD-10-CM

## 2016-09-10 DIAGNOSIS — Z0000 Encounter for general adult medical examination without abnormal findings: Secondary | ICD-10-CM | POA: Diagnosis not present

## 2016-09-10 LAB — CBC WITH DIFFERENTIAL/PLATELET
BASOS ABS: 0 10*3/uL (ref 0.0–0.1)
Basophils Relative: 0.4 % (ref 0.0–3.0)
EOS ABS: 0.1 10*3/uL (ref 0.0–0.7)
Eosinophils Relative: 1.2 % (ref 0.0–5.0)
HEMATOCRIT: 39.2 % (ref 36.0–46.0)
Hemoglobin: 13.1 g/dL (ref 12.0–15.0)
Lymphocytes Relative: 39.6 % (ref 12.0–46.0)
Lymphs Abs: 3.1 10*3/uL (ref 0.7–4.0)
MCHC: 33.3 g/dL (ref 30.0–36.0)
MCV: 84.1 fl (ref 78.0–100.0)
Monocytes Absolute: 0.5 10*3/uL (ref 0.1–1.0)
Monocytes Relative: 7 % (ref 3.0–12.0)
NEUTROS ABS: 4.1 10*3/uL (ref 1.4–7.7)
Neutrophils Relative %: 51.8 % (ref 43.0–77.0)
PLATELETS: 240 10*3/uL (ref 150.0–400.0)
RBC: 4.66 Mil/uL (ref 3.87–5.11)
RDW: 14.2 % (ref 11.5–15.5)
WBC: 7.9 10*3/uL (ref 4.0–10.5)

## 2016-09-10 LAB — BASIC METABOLIC PANEL
BUN: 15 mg/dL (ref 6–23)
CO2: 31 mEq/L (ref 19–32)
CREATININE: 0.83 mg/dL (ref 0.40–1.20)
Calcium: 9.2 mg/dL (ref 8.4–10.5)
Chloride: 103 mEq/L (ref 96–112)
GFR: 88.45 mL/min (ref >60.00)
Glucose, Bld: 89 mg/dL (ref 70–99)
Potassium: 4.2 mEq/L (ref 3.5–5.1)
Sodium: 141 mEq/L (ref 135–145)

## 2016-09-10 LAB — TSH: TSH: 2.09 u[IU]/mL (ref 0.35–4.50)

## 2016-09-10 LAB — LIPID PANEL
CHOL/HDL RATIO: 2
Cholesterol: 128 mg/dL (ref 0–200)
HDL: 56.1 mg/dL (ref >39.00)
LDL CALC: 62 mg/dL (ref 0–99)
NonHDL: 72.28
TRIGLYCERIDES: 53 mg/dL (ref 0.0–149.0)
VLDL: 10.6 mg/dL (ref 0.0–40.0)

## 2016-09-10 LAB — HEPATIC FUNCTION PANEL
ALBUMIN: 4 g/dL (ref 3.5–5.2)
ALT: 18 U/L (ref 0–35)
AST: 13 U/L (ref 0–37)
Alkaline Phosphatase: 59 U/L (ref 39–117)
Bilirubin, Direct: 0.1 mg/dL (ref 0.0–0.3)
TOTAL PROTEIN: 7.1 g/dL (ref 6.0–8.3)
Total Bilirubin: 0.5 mg/dL (ref 0.2–1.2)

## 2016-09-10 LAB — VITAMIN D 25 HYDROXY (VIT D DEFICIENCY, FRACTURES): VITD: 57.67 ng/mL (ref 30.00–100.00)

## 2016-09-10 LAB — HEMOGLOBIN A1C: Hgb A1c MFr Bld: 6.4 % (ref 4.6–6.5)

## 2016-09-10 NOTE — Progress Notes (Signed)
Pre visit review using our clinic review tool, if applicable. No additional management support is needed unless otherwise documented below in the visit note. 

## 2016-09-10 NOTE — Patient Instructions (Addendum)
Follow up in 3-4 months to recheck diabetes We'll notify you of your lab results and make any changes if needed Keep up the good work on healthy diet and regular exercise- you can do it! We'll call you with your stress test appt You are not due for colonoscopy until 2024- yay!!! Call with any questions or concerns Happy [employer] Birthday!!!

## 2016-09-10 NOTE — Assessment & Plan Note (Signed)
Pt's PE WNL w/ exception of obesity.  UTD on GYN, colonoscopy.  Flu shot given today.  Check labs.  Anticipatory guidance provided.

## 2016-09-10 NOTE — Progress Notes (Signed)
   Subjective:    Patient ID: Priscilla Thompson, female    DOB: Mar 08, 1951, 65 y.o.   MRN: [id number]  HPI CPE- UTD on GYN, colonoscopy, eye exam.  Foot exam due today.  No concerns today.  Due for flu shot.  Walking regularly   Review of Systems Patient reports no vision/ hearing changes, adenopathy,fever, weight change,  persistant/recurrent hoarseness , swallowing issues, chest pain, palpitations, edema, persistant/recurrent cough, hemoptysis, dyspnea (rest/exertional/paroxysmal nocturnal), gastrointestinal bleeding (melena, rectal bleeding), abdominal pain, significant heartburn, bowel changes, GU symptoms (dysuria, hematuria, incontinence), Gyn symptoms (abnormal  bleeding, pain),  syncope, focal weakness, memory loss, numbness & tingling, skin/hair/nail changes, abnormal bruising or bleeding, anxiety, or depression.     Objective:   Physical Exam General Appearance:    Alert, cooperative, no distress, appears stated age, obese  Head:    Normocephalic, without obvious abnormality, atraumatic  Eyes:    PERRL, conjunctiva/corneas clear, EOM's intact, fundi    benign, both eyes  Ears:    Normal TM's and external ear canals, both ears  Nose:   Nares normal, septum midline, mucosa normal, no drainage    or sinus tenderness  Throat:   Lips, mucosa, and tongue normal; teeth and gums normal  Neck:   Supple, symmetrical, trachea midline, no adenopathy;    Thyroid: no enlargement/tenderness/nodules  Back:     Symmetric, no curvature, ROM normal, no CVA tenderness  Lungs:     Clear to auscultation bilaterally, respirations unlabored  Chest Wall:    No tenderness or deformity   Heart:    Regular rate and rhythm, S1 and S2 normal, no murmur, rub   or gallop  Breast Exam:    Deferred to GYN  Abdomen:     Soft, non-tender, bowel sounds active all four quadrants,    no masses, no organomegaly  Genitalia:    Deferred to GYN  Rectal:    Extremities:   Extremities normal, atraumatic, no cyanosis or  edema  Pulses:   2+ and symmetric all extremities  Skin:   Skin color, texture, turgor normal, no rashes or lesions  Lymph nodes:   Cervical, supraclavicular, and axillary nodes normal  Neurologic:   CNII-XII intact, normal strength, sensation and reflexes    throughout          Assessment & Plan:

## 2016-09-10 NOTE — Assessment & Plan Note (Signed)
Chronic problem.  Exercising regularly.  UTD on eye exam, foot exam done today.  On ACE for renal protection.  Check labs.  Adjust tx plan as needed

## 2016-09-16 ENCOUNTER — Other Ambulatory Visit: Payer: Self-pay | Admitting: Family Medicine

## 2016-10-10 ENCOUNTER — Other Ambulatory Visit: Payer: Self-pay | Admitting: Family Medicine

## 2016-10-25 ENCOUNTER — Other Ambulatory Visit: Payer: Self-pay | Admitting: Family Medicine

## 2016-11-20 ENCOUNTER — Other Ambulatory Visit: Payer: Self-pay | Admitting: Family Medicine

## 2016-12-16 ENCOUNTER — Ambulatory Visit (INDEPENDENT_AMBULATORY_CARE_PROVIDER_SITE_OTHER): Payer: Managed Care, Other (non HMO) | Admitting: Family Medicine

## 2016-12-16 ENCOUNTER — Encounter: Payer: Self-pay | Admitting: Family Medicine

## 2016-12-16 VITALS — BP 123/80 | HR 72 | Temp 98.1°F | Resp 16 | Ht 63.0 in | Wt 223.1 lb

## 2016-12-16 DIAGNOSIS — E119 Type 2 diabetes mellitus without complications: Secondary | ICD-10-CM

## 2016-12-16 LAB — BASIC METABOLIC PANEL
BUN: 13 mg/dL (ref 6–23)
CALCIUM: 9.7 mg/dL (ref 8.4–10.5)
CO2: 31 mEq/L (ref 19–32)
CREATININE: 0.83 mg/dL (ref 0.40–1.20)
Chloride: 101 mEq/L (ref 96–112)
GFR: 88.37 mL/min (ref >60.00)
GLUCOSE: 101 mg/dL — AB (ref 70–99)
POTASSIUM: 4.2 meq/L (ref 3.5–5.1)
Sodium: 142 mEq/L (ref 135–145)

## 2016-12-16 LAB — HEMOGLOBIN A1C: Hgb A1c MFr Bld: 6.4 % (ref 4.6–6.5)

## 2016-12-16 NOTE — Assessment & Plan Note (Signed)
Chronic problem.  Pt has been doing well w/ controlling her sugars through healthy diet.  Intends to exercise more regularly this year.  UTD on foot exam, eye exam.  On ACE for renal protection.  Check labs.  Start meds prn

## 2016-12-16 NOTE — Patient Instructions (Addendum)
Follow up in 3-4 months to recheck sugar and cholesterol We'll notify you of your lab results and make any changes if needed Continue to work on healthy diet and regular exercise- you can do it! Call with any questions or concerns Happy New Year!!! 

## 2016-12-16 NOTE — Progress Notes (Signed)
Pre visit review using our clinic review tool, if applicable. No additional management support is needed unless otherwise documented below in the visit note. 

## 2016-12-16 NOTE — Progress Notes (Signed)
   Subjective:    Patient ID: Priscilla Thompson, female    DOB: 1951/02/13, 66 y.o.   MRN: [id number]  HPI DM- chronic problem.  Currently diet controlled.  On ACE for renal protection.  UTD on eye exam, foot exam.  Actually down 1 lb since September.  Plans to lose more weight- is eating less and wants to start exercising.     Review of Systems For ROS see HPI     Objective:   Physical Exam  Constitutional: She is oriented to person, place, and time. She appears well-developed and well-nourished. No distress.  obese  HENT:  Head: Normocephalic and atraumatic.  Eyes: Conjunctivae and EOM are normal. Pupils are equal, round, and reactive to light.  Neck: Normal range of motion. Neck supple. No thyromegaly present.  Cardiovascular: Normal rate, regular rhythm, normal heart sounds and intact distal pulses.   No murmur heard. Pulmonary/Chest: Effort normal and breath sounds normal. No respiratory distress.  Abdominal: Soft. She exhibits no distension. There is no tenderness.  Musculoskeletal: She exhibits no edema.  Lymphadenopathy:    She has no cervical adenopathy.  Neurological: She is alert and oriented to person, place, and time.  Skin: Skin is warm and dry.  Psychiatric: She has a normal mood and affect. Her behavior is normal.  Vitals reviewed.         Assessment & Plan:

## 2017-03-24 LAB — HM MAMMOGRAPHY

## 2017-03-24 LAB — HM PAP SMEAR

## 2017-03-24 LAB — HM DEXA SCAN: HM DEXA SCAN: NORMAL

## 2017-03-24 LAB — FECAL OCCULT BLOOD, GUAIAC: Fecal Occult Blood: NEGATIVE

## 2017-03-26 LAB — HM DIABETES EYE EXAM

## 2017-03-29 ENCOUNTER — Other Ambulatory Visit: Payer: Self-pay | Admitting: General Practice

## 2017-03-29 MED ORDER — LISINOPRIL-HYDROCHLOROTHIAZIDE 20-25 MG PO TABS: 1.0000 | ORAL_TABLET | Freq: Every day | ORAL | 1 refills | Status: DC

## 2017-03-29 MED ORDER — LEVOTHYROXINE SODIUM 50 MCG PO TABS: 50.0000 ug | ORAL_TABLET | Freq: Every day | ORAL | 1 refills | Status: DC

## 2017-04-12 ENCOUNTER — Other Ambulatory Visit: Payer: Self-pay | Admitting: General Practice

## 2017-04-12 MED ORDER — ATORVASTATIN CALCIUM 20 MG PO TABS: 20.0000 mg | ORAL_TABLET | Freq: Every day | ORAL | 1 refills | Status: DC

## 2017-04-20 ENCOUNTER — Encounter: Payer: Self-pay | Admitting: Family Medicine

## 2017-04-20 ENCOUNTER — Ambulatory Visit (INDEPENDENT_AMBULATORY_CARE_PROVIDER_SITE_OTHER): Payer: 59 | Admitting: Family Medicine

## 2017-04-20 ENCOUNTER — Encounter: Payer: Self-pay | Admitting: General Practice

## 2017-04-20 VITALS — BP 120/80 | HR 83 | Temp 98.0°F | Resp 16 | Ht 63.0 in | Wt 218.0 lb

## 2017-04-20 DIAGNOSIS — I10 Essential (primary) hypertension: Secondary | ICD-10-CM | POA: Diagnosis not present

## 2017-04-20 DIAGNOSIS — E785 Hyperlipidemia, unspecified: Secondary | ICD-10-CM | POA: Diagnosis not present

## 2017-04-20 DIAGNOSIS — E119 Type 2 diabetes mellitus without complications: Secondary | ICD-10-CM | POA: Diagnosis not present

## 2017-04-20 LAB — BASIC METABOLIC PANEL
BUN: 13 mg/dL (ref 6–23)
CHLORIDE: 103 meq/L (ref 96–112)
CO2: 32 meq/L (ref 19–32)
CREATININE: 0.9 mg/dL (ref 0.40–1.20)
Calcium: 9.6 mg/dL (ref 8.4–10.5)
GFR: 80.41 mL/min (ref >60.00)
GLUCOSE: 88 mg/dL (ref 70–99)
POTASSIUM: 4.3 meq/L (ref 3.5–5.1)
Sodium: 142 mEq/L (ref 135–145)

## 2017-04-20 LAB — CBC WITH DIFFERENTIAL/PLATELET
BASOS PCT: 0.5 % (ref 0.0–3.0)
Basophils Absolute: 0 10*3/uL (ref 0.0–0.1)
EOS ABS: 0.1 10*3/uL (ref 0.0–0.7)
Eosinophils Relative: 1.7 % (ref 0.0–5.0)
HCT: 39.3 % (ref 36.0–46.0)
HEMOGLOBIN: 13 g/dL (ref 12.0–15.0)
LYMPHS ABS: 2.5 10*3/uL (ref 0.7–4.0)
Lymphocytes Relative: 36.3 % (ref 12.0–46.0)
MCHC: 33.1 g/dL (ref 30.0–36.0)
MCV: 85.8 fl (ref 78.0–100.0)
MONO ABS: 0.5 10*3/uL (ref 0.1–1.0)
Monocytes Relative: 6.8 % (ref 3.0–12.0)
NEUTROS PCT: 54.7 % (ref 43.0–77.0)
Neutro Abs: 3.8 10*3/uL (ref 1.4–7.7)
Platelets: 258 10*3/uL (ref 150.0–400.0)
RBC: 4.58 Mil/uL (ref 3.87–5.11)
RDW: 14.6 % (ref 11.5–15.5)
WBC: 6.9 10*3/uL (ref 4.0–10.5)

## 2017-04-20 LAB — HEPATIC FUNCTION PANEL
ALBUMIN: 4.4 g/dL (ref 3.5–5.2)
ALK PHOS: 57 U/L (ref 39–117)
ALT: 17 U/L (ref 0–35)
AST: 15 U/L (ref 0–37)
Bilirubin, Direct: 0.1 mg/dL (ref 0.0–0.3)
Total Bilirubin: 0.6 mg/dL (ref 0.2–1.2)
Total Protein: 7.2 g/dL (ref 6.0–8.3)

## 2017-04-20 LAB — HEMOGLOBIN A1C: Hgb A1c MFr Bld: 6.4 % (ref 4.6–6.5)

## 2017-04-20 LAB — LIPID PANEL
CHOLESTEROL: 128 mg/dL (ref 0–200)
HDL: 65.1 mg/dL (ref >39.00)
LDL Cholesterol: 53 mg/dL (ref 0–99)
NonHDL: 62.4
TRIGLYCERIDES: 47 mg/dL (ref 0.0–149.0)
Total CHOL/HDL Ratio: 2
VLDL: 9.4 mg/dL (ref 0.0–40.0)

## 2017-04-20 LAB — TSH: TSH: 1.47 u[IU]/mL (ref 0.35–4.50)

## 2017-04-20 NOTE — Progress Notes (Signed)
   Subjective:    Patient ID: Priscilla Thompson, female    DOB: [birth year], 66 y.o.   MRN: [id number]  HPI DM- chronic problem, currently diet controlled.  On ACE for renal protection.  UTD on eye exam, foot exam.  Pt is down 5 lbs since last visit.  Pt reports eating better and increased walking.  No numbness/tingling of hands/feet  Hyperlipidemia- chronic problem, on Lipitor daily.  No abd pain, N/V, myalgias  HTN- chronic problem, on Lisinopril HCTZ and Amlodipine w/ good control.  No CP, SOB, HAs, visual changes, edema.   Review of Systems For ROS see HPI     Objective:   Physical Exam  Constitutional: She is oriented to person, place, and time. She appears well-developed and well-nourished. No distress.  obese  HENT:  Head: Normocephalic and atraumatic.  Eyes: Conjunctivae and EOM are normal. Pupils are equal, round, and reactive to light.  Neck: Normal range of motion. Neck supple. No thyromegaly present.  Cardiovascular: Normal rate, regular rhythm, normal heart sounds and intact distal pulses.   No murmur heard. Pulmonary/Chest: Effort normal and breath sounds normal. No respiratory distress.  Abdominal: Soft. She exhibits no distension. There is no tenderness.  Musculoskeletal: She exhibits no edema.  Lymphadenopathy:    She has no cervical adenopathy.  Neurological: She is alert and oriented to person, place, and time.  Skin: Skin is warm and dry.  Psychiatric: She has a normal mood and affect. Her behavior is normal.  Vitals reviewed.         Assessment & Plan:

## 2017-04-20 NOTE — Progress Notes (Signed)
Pre visit review using our clinic review tool, if applicable. No additional management support is needed unless otherwise documented below in the visit note. 

## 2017-04-20 NOTE — Assessment & Plan Note (Signed)
Chronic problem.  Tolerating statin w/o difficulty.  Encouraged healthy diet and regular exercise.  Check labs.  Adjust meds prn. 

## 2017-04-20 NOTE — Assessment & Plan Note (Signed)
Chronic problem.  Currently diet controlled.  UTD on foot exam, eye exam.  On ACE for renal protection.  Stressed need for healthy diet and regular exercise.  Check labs.  Adjust meds prn

## 2017-04-20 NOTE — Patient Instructions (Signed)
Schedule your complete physical for late September/October We'll notify you of your lab results and make any changes if needed Keep up the good work on healthy diet and regular exercise- you're doing great! Call with any questions or concerns Have a great summer!!!

## 2017-04-20 NOTE — Assessment & Plan Note (Signed)
Chronic problem.  Well controlled today.  Asymptomatic.  Check labs.  No anticipated med changes.  Will follow. 

## 2017-05-13 ENCOUNTER — Other Ambulatory Visit: Payer: Self-pay | Admitting: General Practice

## 2017-05-13 MED ORDER — POTASSIUM CHLORIDE CRYS ER 20 MEQ PO TBCR: 20.0000 meq | EXTENDED_RELEASE_TABLET | Freq: Every day | ORAL | 1 refills | Status: DC

## 2017-05-13 MED ORDER — AMLODIPINE BESYLATE 5 MG PO TABS: 5.0000 mg | ORAL_TABLET | Freq: Every day | ORAL | 1 refills | Status: DC

## 2017-07-27 ENCOUNTER — Encounter: Payer: Self-pay | Admitting: Family Medicine

## 2017-07-27 MED ORDER — CYCLOBENZAPRINE HCL 5 MG PO TABS: ORAL_TABLET | ORAL | 0 refills | Status: DC

## 2017-07-27 MED ORDER — DICLOFENAC SODIUM 50 MG PO TBEC: 50.0000 mg | DELAYED_RELEASE_TABLET | Freq: Two times a day (BID) | ORAL | 0 refills | Status: DC

## 2017-09-13 ENCOUNTER — Other Ambulatory Visit: Payer: Self-pay | Admitting: General Practice

## 2017-09-13 MED ORDER — LISINOPRIL-HYDROCHLOROTHIAZIDE 20-25 MG PO TABS: 1.0000 | ORAL_TABLET | Freq: Every day | ORAL | 1 refills | Status: DC

## 2017-09-17 ENCOUNTER — Encounter: Payer: Self-pay | Admitting: Physician Assistant

## 2017-09-17 ENCOUNTER — Encounter: Payer: 59 | Admitting: Family Medicine

## 2017-09-17 ENCOUNTER — Ambulatory Visit (INDEPENDENT_AMBULATORY_CARE_PROVIDER_SITE_OTHER): Payer: 59 | Admitting: Physician Assistant

## 2017-09-17 VITALS — BP 128/86 | HR 90 | Temp 98.0°F | Resp 14 | Ht 63.0 in | Wt 215.0 lb

## 2017-09-17 DIAGNOSIS — E669 Obesity, unspecified: Secondary | ICD-10-CM

## 2017-09-17 DIAGNOSIS — Z0000 Encounter for general adult medical examination without abnormal findings: Secondary | ICD-10-CM | POA: Diagnosis not present

## 2017-09-17 DIAGNOSIS — E038 Other specified hypothyroidism: Secondary | ICD-10-CM | POA: Diagnosis not present

## 2017-09-17 DIAGNOSIS — E119 Type 2 diabetes mellitus without complications: Secondary | ICD-10-CM

## 2017-09-17 DIAGNOSIS — E559 Vitamin D deficiency, unspecified: Secondary | ICD-10-CM

## 2017-09-17 DIAGNOSIS — Z23 Encounter for immunization: Secondary | ICD-10-CM | POA: Diagnosis not present

## 2017-09-17 DIAGNOSIS — E785 Hyperlipidemia, unspecified: Secondary | ICD-10-CM | POA: Diagnosis not present

## 2017-09-17 LAB — COMPREHENSIVE METABOLIC PANEL
ALBUMIN: 4.2 g/dL (ref 3.5–5.2)
ALK PHOS: 54 U/L (ref 39–117)
ALT: 18 U/L (ref 0–35)
AST: 14 U/L (ref 0–37)
BUN: 12 mg/dL (ref 6–23)
CALCIUM: 9.5 mg/dL (ref 8.4–10.5)
CHLORIDE: 101 meq/L (ref 96–112)
CO2: 32 mEq/L (ref 19–32)
CREATININE: 0.87 mg/dL (ref 0.40–1.20)
GFR: 83.51 mL/min (ref >60.00)
Glucose, Bld: 91 mg/dL (ref 70–99)
POTASSIUM: 3.8 meq/L (ref 3.5–5.1)
Sodium: 139 mEq/L (ref 135–145)
TOTAL PROTEIN: 7.1 g/dL (ref 6.0–8.3)
Total Bilirubin: 0.6 mg/dL (ref 0.2–1.2)

## 2017-09-17 LAB — CBC
HEMATOCRIT: 39.4 % (ref 36.0–46.0)
Hemoglobin: 12.9 g/dL (ref 12.0–15.0)
MCHC: 32.8 g/dL (ref 30.0–36.0)
MCV: 87.2 fl (ref 78.0–100.0)
PLATELETS: 264 10*3/uL (ref 150.0–400.0)
RBC: 4.52 Mil/uL (ref 3.87–5.11)
RDW: 13.6 % (ref 11.5–15.5)
WBC: 7.3 10*3/uL (ref 4.0–10.5)

## 2017-09-17 LAB — LIPID PANEL
CHOLESTEROL: 131 mg/dL (ref 0–200)
HDL: 63.4 mg/dL (ref >39.00)
LDL CALC: 56 mg/dL (ref 0–99)
NonHDL: 67.16
TRIGLYCERIDES: 54 mg/dL (ref 0.0–149.0)
Total CHOL/HDL Ratio: 2
VLDL: 10.8 mg/dL (ref 0.0–40.0)

## 2017-09-17 LAB — TSH: TSH: 1.76 u[IU]/mL (ref 0.35–4.50)

## 2017-09-17 LAB — VITAMIN D 25 HYDROXY (VIT D DEFICIENCY, FRACTURES): VITD: 51.3 ng/mL (ref 30.00–100.00)

## 2017-09-17 LAB — HEMOGLOBIN A1C: HEMOGLOBIN A1C: 6.3 % (ref 4.6–6.5)

## 2017-09-17 NOTE — Assessment & Plan Note (Signed)
Dietary and exercise measures reviewed. Will monitor.

## 2017-09-17 NOTE — Patient Instructions (Addendum)
Please go to the lab for blood work.   Our office will call you with your results unless you have chosen to receive results via MyChart.  If your blood work is normal we will follow-up each year for physicals and as scheduled for chronic medical problems.  If anything is abnormal we will treat accordingly and get you in for a follow-up.  Please continue medications as directed.  Follow-up with Dr. Birdie Riddle in 6 months.  Return sooner if needed.   Preventive Care 67 Years and Older, Female Preventive care refers to lifestyle choices and visits with your health care provider that can promote health and wellness. What does preventive care include?  A yearly physical exam. This is also called an annual well check.  Dental exams once or twice a year.  Routine eye exams. Ask your health care provider how often you should have your eyes checked.  Personal lifestyle choices, including: ? Daily care of your teeth and gums. ? Regular physical activity. ? Eating a healthy diet. ? Avoiding tobacco and drug use. ? Limiting alcohol use. ? Practicing safe sex. ? Taking low-dose aspirin every day. ? Taking vitamin and mineral supplements as recommended by your health care provider. What happens during an annual well check? The services and screenings done by your health care provider during your annual well check will depend on your age, overall health, lifestyle risk factors, and family history of disease. Counseling Your health care provider may ask you questions about your:  Alcohol use.  Tobacco use.  Drug use.  Emotional well-being.  Home and relationship well-being.  Sexual activity.  Eating habits.  History of falls.  Memory and ability to understand (cognition).  Work and work Statistician.  Reproductive health.  Screening You may have the following tests or measurements:  Height, weight, and BMI.  Blood pressure.  Lipid and cholesterol levels. These may be  checked every 5 years, or more frequently if you are over 48 years old.  Skin check.  Lung cancer screening. You may have this screening every year starting at age 93 if you have a 30-pack-year history of smoking and currently smoke or have quit within the past 15 years.  Fecal occult blood test (FOBT) of the stool. You may have this test every year starting at age 12.  Flexible sigmoidoscopy or colonoscopy. You may have a sigmoidoscopy every 5 years or a colonoscopy every 10 years starting at age 59.  Hepatitis C blood test.  Hepatitis B blood test.  Sexually transmitted disease (STD) testing.  Diabetes screening. This is done by checking your blood sugar (glucose) after you have not eaten for a while (fasting). You may have this done every 1-3 years.  Bone density scan. This is done to screen for osteoporosis. You may have this done starting at age 93.  Mammogram. This may be done every 1-2 years. Talk to your health care provider about how often you should have regular mammograms.  Talk with your health care provider about your test results, treatment options, and if necessary, the need for more tests. Vaccines Your health care provider may recommend certain vaccines, such as:  Influenza vaccine. This is recommended every year.  Tetanus, diphtheria, and acellular pertussis (Tdap, Td) vaccine. You may need a Td booster every 10 years.  Varicella vaccine. You may need this if you have not been vaccinated.  Zoster vaccine. You may need this after age 23.  Measles, mumps, and rubella (MMR) vaccine. You may need at  least one dose of MMR if you were born in 1957 or later. You may also need a second dose.  Pneumococcal 13-valent conjugate (PCV13) vaccine. One dose is recommended after age 31.  Pneumococcal polysaccharide (PPSV23) vaccine. One dose is recommended after age 15.  Meningococcal vaccine. You may need this if you have certain conditions.  Hepatitis A vaccine. You may  need this if you have certain conditions or if you travel or work in places where you may be exposed to hepatitis A.  Hepatitis B vaccine. You may need this if you have certain conditions or if you travel or work in places where you may be exposed to hepatitis B.  Haemophilus influenzae type b (Hib) vaccine. You may need this if you have certain conditions.  Talk to your health care provider about which screenings and vaccines you need and how often you need them. This information is not intended to replace advice given to you by your health care provider. Make sure you discuss any questions you have with your health care provider. Document Released: 12/27/2015 Document Revised: 08/19/2016 Document Reviewed: 10/01/2015 Elsevier Interactive Patient Education  2017 [employer].

## 2017-09-17 NOTE — Assessment & Plan Note (Signed)
Taking medications as directed. Repeat labs today. 

## 2017-09-17 NOTE — Addendum Note (Signed)
Addended by: Lenis Dickinson on: 09/17/2017 01:08 PM   Modules accepted: Orders

## 2017-09-17 NOTE — Assessment & Plan Note (Signed)
Repeat labs today

## 2017-09-17 NOTE — Progress Notes (Signed)
Pre visit review using our clinic review tool, if applicable. No additional management support is needed unless otherwise documented below in the visit note. 

## 2017-09-17 NOTE — Assessment & Plan Note (Signed)
Diet-controlled. Is taking ACEI. Foot exam updated today. Some onychomycosis noted. No other abnormal findings. Eye exam up-to-date. Prevnar and Flu shot given. Repeat labs today.

## 2017-09-17 NOTE — Progress Notes (Signed)
Patient presents to clinic today for annual exam.  Patient is fasting for labs. Diet -- Endorses well-balanced diet.  Exercise -- Is walking for 30-45 minutes, three times per week.  Acute Concerns: Denies acute concerns today.  Chronic Issues: Glaucoma -- Followed by Ophthalmology, Dr. Dione Booze. Endorses recent visit and was told everything looked good.  Hypertension -- Currently on regimen of amlodipine 5 mg daily and lisinopril-HCTZ daily.  Patient denies chest pain, palpitations, lightheadedness, dizziness, vision changes or frequent headaches.  BP Readings from Last 3 Encounters:  09/17/17 128/86  04/20/17 120/80  12/16/16 123/80   Diabetes Mellitus II -- Diet controlled. Is staying active. Endorses checking fasting sugars weekly -- 155 or less. Eye exam up-to-date. Due for foot exam. Denies concerns today. Is due for flu shot and Prevnar.  Hypothyroidism -- Taking her levothyroxine as directed.  Health Maintenance: Immunizations -- Due for flu shot and Prevnar.  Colonoscopy -- up to date 03/10/13 Mammogram -- up-to-date 03/24/2017 Bone Density -- up-to-date.   Past Medical History:  Diagnosis Date  . Diabetes mellitus   . Hyperlipidemia   . Hypertension   . Hypothyroid   . Osteoarthritis   . Thyroid disease     History reviewed. No pertinent surgical history.  Current Outpatient Prescriptions on File Prior to Visit  Medication Sig Dispense Refill  . acetaminophen (TYLENOL) 650 MG CR tablet Take 1,300 mg by mouth every 8 (eight) hours as needed for pain (for arthritis).    Marland Kitchen amLODipine (NORVASC) 5 MG tablet Take 1 tablet (5 mg total) by mouth daily. 90 tablet 1  . aspirin 81 MG tablet Take 81 mg by mouth daily.    Marland Kitchen atorvastatin (LIPITOR) 20 MG tablet Take 1 tablet (20 mg total) by mouth daily. 90 tablet 1  . Calcium Carb-Cholecalciferol (CALCIUM-VITAMIN D) 500-400 MG-UNIT TABS Take 1 tablet by mouth daily.    . Cholecalciferol (VITAMIN D3) 1000 UNITS CAPS Take 1  capsule by mouth daily.    . cyclobenzaprine (FLEXERIL) 5 MG tablet 1 tab po q hs as needed trapezius pain or muscle spasma. 30 tablet 0  . diclofenac (VOLTAREN) 50 MG EC tablet Take 1 tablet (50 mg total) by mouth 2 (two) times daily. 60 tablet 0  . fish oil-omega-3 fatty acids 1000 MG capsule Take 1,200 mg by mouth 3 (three) times daily.     Marland Kitchen latanoprost (XALATAN) 0.005 % ophthalmic solution Place 1 drop into both eyes at bedtime.     Marland Kitchen levothyroxine (SYNTHROID) 50 MCG tablet Take 1 tablet (50 mcg total) by mouth daily. 90 tablet 1  . lisinopril-hydrochlorothiazide (PRINZIDE,ZESTORETIC) 20-25 MG tablet Take 1 tablet by mouth daily. 90 tablet 1  . Multiple Vitamins-Minerals (MULTI FOR HER 50+ PO) Take 2 chew by mouth daily    . omeprazole (PRILOSEC) 20 MG capsule Take 20 mg by mouth daily.    . potassium chloride SA (KLOR-CON M20) 20 MEQ tablet Take 1 tablet (20 mEq total) by mouth daily. 90 tablet 1  . Biotin 2500 MCG CAPS Take 1 capsule by mouth daily.     No current facility-administered medications on file prior to visit.     No Known Allergies  Family History  Problem Relation Age of Onset  . Diabetes Sister   . Diabetes Brother   . Cancer Brother        liver, lung    Social History   Social History  . Marital status: Single    Spouse name: N/A  .  Number of children: N/A  . Years of education: N/A   Occupational History  . Not on file.   Social History Main Topics  . Smoking status: Never Smoker  . Smokeless tobacco: Never Used  . Alcohol use 0.0 oz/week     Comment: socially  . Drug use: No  . Sexual activity: Not on file   Other Topics Concern  . Not on file   Social History Narrative  . No narrative on file   Review of Systems  Constitutional: Negative for fever and weight loss.  HENT: Negative for ear discharge, ear pain, hearing loss and tinnitus.   Eyes: Negative for blurred vision, double vision, photophobia and pain.  Respiratory: Negative for  cough and shortness of breath.   Cardiovascular: Negative for chest pain and palpitations.  Gastrointestinal: Negative for abdominal pain, blood in stool, constipation, diarrhea, heartburn, melena, nausea and vomiting.  Genitourinary: Negative for dysuria, flank pain, frequency, hematuria and urgency.  Musculoskeletal: Negative for falls.  Neurological: Negative for dizziness, loss of consciousness and headaches.  Endo/Heme/Allergies: Negative for environmental allergies.  Psychiatric/Behavioral: Negative for depression, hallucinations, substance abuse and suicidal ideas. The patient is not nervous/anxious and does not have insomnia.    BP 128/86   Pulse 90   Temp 98 F (36.7 C) (Oral)   Resp 14   Ht  (1.6 m)   Wt 215 lb (97.5 kg)   SpO2 99%   BMI 38.09 kg/m   Physical Exam  Constitutional: She is oriented to person, place, and time and well-developed, well-nourished, and in no distress.  HENT:  Head: Normocephalic and atraumatic.  Right Ear: Tympanic membrane, external ear and ear canal normal.  Left Ear: Tympanic membrane, external ear and ear canal normal.  Nose: Nose normal. No mucosal edema.  Mouth/Throat: Uvula is midline, oropharynx is clear and moist and mucous membranes are normal. No oropharyngeal exudate or posterior oropharyngeal erythema.  Eyes: Pupils are equal, round, and reactive to light. Conjunctivae are normal.  Neck: Neck supple. No thyromegaly present.  Cardiovascular: Normal rate, regular rhythm, normal heart sounds and intact distal pulses.   Pulmonary/Chest: Effort normal and breath sounds normal. No respiratory distress. She has no wheezes. She has no rales.  Abdominal: Soft. Bowel sounds are normal. She exhibits no distension and no mass. There is no tenderness. There is no rebound and no guarding.  Lymphadenopathy:    She has no cervical adenopathy.  Neurological: She is alert and oriented to person, place, and time. No cranial nerve deficit.  Skin:  Skin is warm and dry. No rash noted.  Psychiatric: Affect normal.  Vitals reviewed.   Diabetic Foot Exam - Simple   Simple Foot Form Diabetic Foot exam was performed with the following findings:  Yes 09/17/2017 10:01 AM  Visual Inspection No deformities, no ulcerations, no other skin breakdown bilaterally:  Yes See comments:  Yes Sensation Testing Intact to touch and monofilament testing bilaterally:  Yes Pulse Check Posterior Tibialis and Dorsalis pulse intact bilaterally:  Yes Comments Onychomycosis of nails noted. Patient declines treatment.      Assessment/Plan: Diet-controlled diabetes mellitus Diet-controlled. Is taking ACEI. Foot exam updated today. Some onychomycosis noted. No other abnormal findings. Eye exam up-to-date. Prevnar and Flu shot given. Repeat labs today.  General medical examination Depression screen negative. Health Maintenance reviewed.. Preventive schedule discussed and handout given in AVS. Will obtain fasting labs today.    Hyperlipidemia Taking medications as directed. Repeat labs today.  Obesity (BMI 30-39.9) Dietary  and exercise measures reviewed. Will monitor.  Hypothyroidism Repeat labs today.    Piedad Climes, PA-C

## 2017-09-17 NOTE — Assessment & Plan Note (Signed)
Depression screen negative. Health Maintenance reviewed. Preventive schedule discussed and handout given in AVS. Will obtain fasting labs today.  

## 2017-10-10 ENCOUNTER — Other Ambulatory Visit: Payer: Self-pay | Admitting: Family Medicine

## 2017-10-12 ENCOUNTER — Other Ambulatory Visit: Payer: Self-pay | Admitting: General Practice

## 2017-10-12 MED ORDER — LEVOTHYROXINE SODIUM 50 MCG PO TABS: 50.0000 ug | ORAL_TABLET | Freq: Every day | ORAL | 1 refills | Status: DC

## 2018-01-12 ENCOUNTER — Encounter: Payer: Self-pay | Admitting: Family Medicine

## 2018-01-12 MED ORDER — LISINOPRIL-HYDROCHLOROTHIAZIDE 20-25 MG PO TABS: 1.0000 | ORAL_TABLET | Freq: Every day | ORAL | 1 refills | Status: DC

## 2018-01-12 MED ORDER — AMLODIPINE BESYLATE 5 MG PO TABS: 5.0000 mg | ORAL_TABLET | Freq: Every day | ORAL | 1 refills | Status: DC

## 2018-02-02 ENCOUNTER — Encounter: Payer: Self-pay | Admitting: Family Medicine

## 2018-02-03 MED ORDER — ATORVASTATIN CALCIUM 20 MG PO TABS: 20.0000 mg | ORAL_TABLET | Freq: Every day | ORAL | 1 refills | Status: DC

## 2018-02-03 MED ORDER — LEVOTHYROXINE SODIUM 50 MCG PO TABS: 50.0000 ug | ORAL_TABLET | Freq: Every day | ORAL | 1 refills | Status: DC

## 2018-03-18 ENCOUNTER — Ambulatory Visit: Payer: 59 | Admitting: Family Medicine

## 2018-06-20 ENCOUNTER — Ambulatory Visit: Payer: Self-pay | Admitting: Family Medicine

## 2018-06-21 DIAGNOSIS — Z86010 Personal history of colonic polyps: Secondary | ICD-10-CM | POA: Diagnosis not present

## 2018-06-21 DIAGNOSIS — K219 Gastro-esophageal reflux disease without esophagitis: Secondary | ICD-10-CM | POA: Diagnosis not present

## 2018-06-21 DIAGNOSIS — Z1211 Encounter for screening for malignant neoplasm of colon: Secondary | ICD-10-CM | POA: Diagnosis not present

## 2018-06-22 DIAGNOSIS — E119 Type 2 diabetes mellitus without complications: Secondary | ICD-10-CM | POA: Diagnosis not present

## 2018-06-22 DIAGNOSIS — H5213 Myopia, bilateral: Secondary | ICD-10-CM | POA: Diagnosis not present

## 2018-06-22 LAB — HM DIABETES EYE EXAM

## 2018-06-23 ENCOUNTER — Ambulatory Visit (INDEPENDENT_AMBULATORY_CARE_PROVIDER_SITE_OTHER): Payer: Medicare Other | Admitting: Family Medicine

## 2018-06-23 ENCOUNTER — Other Ambulatory Visit: Payer: Self-pay

## 2018-06-23 ENCOUNTER — Encounter: Payer: Self-pay | Admitting: Family Medicine

## 2018-06-23 VITALS — BP 106/82 | HR 92 | Temp 97.4°F | Resp 16 | Ht 63.0 in | Wt 209.2 lb

## 2018-06-23 DIAGNOSIS — E119 Type 2 diabetes mellitus without complications: Secondary | ICD-10-CM

## 2018-06-23 DIAGNOSIS — E785 Hyperlipidemia, unspecified: Secondary | ICD-10-CM | POA: Diagnosis not present

## 2018-06-23 DIAGNOSIS — I10 Essential (primary) hypertension: Secondary | ICD-10-CM

## 2018-06-23 LAB — CBC WITH DIFFERENTIAL/PLATELET
BASOS ABS: 0 10*3/uL (ref 0.0–0.1)
Basophils Relative: 0.5 % (ref 0.0–3.0)
EOS ABS: 0.1 10*3/uL (ref 0.0–0.7)
Eosinophils Relative: 1.3 % (ref 0.0–5.0)
HEMATOCRIT: 39.4 % (ref 36.0–46.0)
Hemoglobin: 13.1 g/dL (ref 12.0–15.0)
Lymphocytes Relative: 35.3 % (ref 12.0–46.0)
Lymphs Abs: 2.6 10*3/uL (ref 0.7–4.0)
MCHC: 33.2 g/dL (ref 30.0–36.0)
MCV: 86.3 fl (ref 78.0–100.0)
MONOS PCT: 6.3 % (ref 3.0–12.0)
Monocytes Absolute: 0.5 10*3/uL (ref 0.1–1.0)
Neutro Abs: 4.2 10*3/uL (ref 1.4–7.7)
Neutrophils Relative %: 56.6 % (ref 43.0–77.0)
Platelets: 250 10*3/uL (ref 150.0–400.0)
RBC: 4.57 Mil/uL (ref 3.87–5.11)
RDW: 13.7 % (ref 11.5–15.5)
WBC: 7.3 10*3/uL (ref 4.0–10.5)

## 2018-06-23 LAB — LIPID PANEL
Cholesterol: 135 mg/dL (ref 0–200)
HDL: 63.9 mg/dL (ref >39.00)
LDL Cholesterol: 59 mg/dL (ref 0–99)
NONHDL: 71.1
Total CHOL/HDL Ratio: 2
Triglycerides: 61 mg/dL (ref 0.0–149.0)
VLDL: 12.2 mg/dL (ref 0.0–40.0)

## 2018-06-23 LAB — HEMOGLOBIN A1C: HEMOGLOBIN A1C: 6.3 % (ref 4.6–6.5)

## 2018-06-23 LAB — BASIC METABOLIC PANEL
BUN: 15 mg/dL (ref 6–23)
CALCIUM: 9.6 mg/dL (ref 8.4–10.5)
CO2: 30 mEq/L (ref 19–32)
CREATININE: 0.96 mg/dL (ref 0.40–1.20)
Chloride: 104 mEq/L (ref 96–112)
GFR: 74.37 mL/min (ref >60.00)
GLUCOSE: 91 mg/dL (ref 70–99)
Potassium: 3.8 mEq/L (ref 3.5–5.1)
SODIUM: 143 meq/L (ref 135–145)

## 2018-06-23 LAB — HEPATIC FUNCTION PANEL
ALBUMIN: 4.3 g/dL (ref 3.5–5.2)
ALT: 16 U/L (ref 0–35)
AST: 16 U/L (ref 0–37)
Alkaline Phosphatase: 55 U/L (ref 39–117)
Bilirubin, Direct: 0.1 mg/dL (ref 0.0–0.3)
TOTAL PROTEIN: 7.2 g/dL (ref 6.0–8.3)
Total Bilirubin: 0.7 mg/dL (ref 0.2–1.2)

## 2018-06-23 LAB — TSH: TSH: 1.47 u[IU]/mL (ref 0.35–4.50)

## 2018-06-23 NOTE — Assessment & Plan Note (Signed)
Pt's BMi is 37.1  Given her dx of HTN, Hyperlipidemia, and DM this qualifies as morbidly obese.  Stressed need for healthy diet and regular exercise.  Check labs to risk stratify.  Will follow.

## 2018-06-23 NOTE — Progress Notes (Signed)
   Subjective:    Patient ID: Priscilla Thompson, female    DOB: 06-06-1951, 67 y.o.   MRN: [id number]  HPI DM- chronic problem, currently diet controlled.  UTD on eye exam.  On ACE for renal protection.  Due for foot exam.  No numbness/tingling of hands/feet.  HTN- chronic problem, on Amlodipine 5mg  and Lisinopril HCTZ 20/25mg  daily w/ good control.  No CP, SOB, HAs, visual change, edema.  Hyperlipidemia- chronic problem, on Lipitor 20mg  daily.  Pt is down 6 lbs since last visit.  No abd pain, N/V.  Hypothyroid- chronic problem, on Levothyroxine 50mcg daily.  Denies fatigue.  No changes to skin/hair/nails.   Review of Systems For ROS see HPI     Objective:   Physical Exam  Constitutional: She is oriented to person, place, and time. She appears well-developed and well-nourished. No distress.  obese  HENT:  Head: Normocephalic and atraumatic.  Eyes: Pupils are equal, round, and reactive to light. Conjunctivae and EOM are normal.  Neck: Normal range of motion. Neck supple. No thyromegaly present.  Cardiovascular: Normal rate, regular rhythm, normal heart sounds and intact distal pulses.  No murmur heard. Pulmonary/Chest: Effort normal and breath sounds normal. No respiratory distress.  Abdominal: Soft. She exhibits no distension. There is no tenderness.  Musculoskeletal: She exhibits no edema.  Lymphadenopathy:    She has no cervical adenopathy.  Neurological: She is alert and oriented to person, place, and time.  Skin: Skin is warm and dry.  Psychiatric: She has a normal mood and affect. Her behavior is normal.  Vitals reviewed.         Assessment & Plan:

## 2018-06-23 NOTE — Patient Instructions (Signed)
Schedule your complete physical in 6 months We'll notify you of your lab results and make any changes if needed Keep up the good work on healthy diet and regular exercise- you look great! Call with any questions or concerns Have a great summer!!! 

## 2018-06-23 NOTE — Assessment & Plan Note (Signed)
Chronic problem.  Excellent control.  Asymptomatic.  Check labs.  No anticipated med changes.  Will follow. 

## 2018-06-23 NOTE — Assessment & Plan Note (Signed)
Chronic problem.  UTD on eye exam.  On ACE for renal protection.  Foot exam done today.  Applauded her weight loss efforts.  Check labs.  Adjust tx plan prn.

## 2018-06-23 NOTE — Assessment & Plan Note (Signed)
Chronic problem.  On Lipitor daily.  Tolerating statin w/o difficulty.  Check labs.  Adjust meds prn

## 2018-06-24 ENCOUNTER — Encounter: Payer: Self-pay | Admitting: General Practice

## 2018-06-25 ENCOUNTER — Other Ambulatory Visit: Payer: Self-pay | Admitting: Family Medicine

## 2018-06-29 ENCOUNTER — Other Ambulatory Visit: Payer: Self-pay | Admitting: Family Medicine

## 2018-07-06 DIAGNOSIS — Z86010 Personal history of colonic polyps: Secondary | ICD-10-CM | POA: Diagnosis not present

## 2018-07-06 DIAGNOSIS — Z1211 Encounter for screening for malignant neoplasm of colon: Secondary | ICD-10-CM | POA: Diagnosis not present

## 2018-07-06 LAB — HM COLONOSCOPY

## 2018-07-08 ENCOUNTER — Encounter: Payer: Self-pay | Admitting: General Practice

## 2018-07-19 ENCOUNTER — Encounter: Payer: Self-pay | Admitting: General Practice

## 2018-07-27 ENCOUNTER — Other Ambulatory Visit: Payer: Self-pay | Admitting: Family Medicine

## 2018-07-27 MED ORDER — CYCLOBENZAPRINE HCL 5 MG PO TABS: ORAL_TABLET | ORAL | 0 refills | Status: DC

## 2018-07-27 MED ORDER — DICLOFENAC SODIUM 50 MG PO TBEC: 50.0000 mg | DELAYED_RELEASE_TABLET | Freq: Two times a day (BID) | ORAL | 0 refills | Status: DC

## 2018-07-27 NOTE — Telephone Encounter (Signed)
Last OV 06/23/18, Next OV 01/03/19  Voltaren last filled 07/27/17, # 60 with 0 refills  Flexeril last filled 07/27/17, # 30 with 0 refills  Please advise if okay to continue

## 2018-07-29 ENCOUNTER — Other Ambulatory Visit: Payer: Self-pay | Admitting: Family Medicine

## 2018-08-23 ENCOUNTER — Other Ambulatory Visit: Payer: Self-pay | Admitting: Family Medicine

## 2018-08-24 ENCOUNTER — Other Ambulatory Visit: Payer: Self-pay | Admitting: Family Medicine

## 2018-08-24 DIAGNOSIS — Z1231 Encounter for screening mammogram for malignant neoplasm of breast: Secondary | ICD-10-CM | POA: Diagnosis not present

## 2018-08-31 ENCOUNTER — Encounter: Payer: Self-pay | Admitting: Family Medicine

## 2018-08-31 DIAGNOSIS — H04123 Dry eye syndrome of bilateral lacrimal glands: Secondary | ICD-10-CM | POA: Diagnosis not present

## 2018-08-31 DIAGNOSIS — H2513 Age-related nuclear cataract, bilateral: Secondary | ICD-10-CM | POA: Diagnosis not present

## 2018-08-31 DIAGNOSIS — E119 Type 2 diabetes mellitus without complications: Secondary | ICD-10-CM | POA: Diagnosis not present

## 2018-08-31 DIAGNOSIS — H40053 Ocular hypertension, bilateral: Secondary | ICD-10-CM | POA: Diagnosis not present

## 2018-08-31 LAB — HM DIABETES EYE EXAM

## 2018-09-14 ENCOUNTER — Encounter: Payer: Self-pay | Admitting: General Practice

## 2018-09-16 ENCOUNTER — Other Ambulatory Visit: Payer: Self-pay | Admitting: Family Medicine

## 2018-11-07 ENCOUNTER — Encounter: Payer: Self-pay | Admitting: Family Medicine

## 2018-11-08 MED ORDER — POTASSIUM CHLORIDE CRYS ER 20 MEQ PO TBCR: 20.0000 meq | EXTENDED_RELEASE_TABLET | Freq: Every day | ORAL | 1 refills | Status: DC

## 2018-12-19 ENCOUNTER — Other Ambulatory Visit: Payer: Self-pay | Admitting: Family Medicine

## 2019-01-03 ENCOUNTER — Encounter: Payer: Medicare Other | Admitting: Family Medicine

## 2019-01-19 ENCOUNTER — Other Ambulatory Visit: Payer: Self-pay | Admitting: Family Medicine

## 2019-01-21 ENCOUNTER — Other Ambulatory Visit: Payer: Self-pay | Admitting: Family Medicine

## 2019-02-17 ENCOUNTER — Other Ambulatory Visit: Payer: Self-pay | Admitting: Family Medicine

## 2019-03-01 DIAGNOSIS — H40053 Ocular hypertension, bilateral: Secondary | ICD-10-CM | POA: Diagnosis not present

## 2019-03-01 DIAGNOSIS — H2513 Age-related nuclear cataract, bilateral: Secondary | ICD-10-CM | POA: Diagnosis not present

## 2019-03-01 DIAGNOSIS — H04123 Dry eye syndrome of bilateral lacrimal glands: Secondary | ICD-10-CM | POA: Diagnosis not present

## 2019-03-12 ENCOUNTER — Other Ambulatory Visit: Payer: Self-pay | Admitting: Family Medicine

## 2019-03-29 ENCOUNTER — Encounter: Payer: Self-pay | Admitting: Family Medicine

## 2019-03-29 ENCOUNTER — Other Ambulatory Visit: Payer: Self-pay

## 2019-03-29 ENCOUNTER — Ambulatory Visit (INDEPENDENT_AMBULATORY_CARE_PROVIDER_SITE_OTHER): Payer: Medicare Other | Admitting: Family Medicine

## 2019-03-29 VITALS — Ht 63.0 in | Wt 205.0 lb

## 2019-03-29 DIAGNOSIS — E038 Other specified hypothyroidism: Secondary | ICD-10-CM | POA: Diagnosis not present

## 2019-03-29 DIAGNOSIS — I10 Essential (primary) hypertension: Secondary | ICD-10-CM | POA: Diagnosis not present

## 2019-03-29 DIAGNOSIS — E119 Type 2 diabetes mellitus without complications: Secondary | ICD-10-CM

## 2019-03-29 DIAGNOSIS — E559 Vitamin D deficiency, unspecified: Secondary | ICD-10-CM

## 2019-03-29 DIAGNOSIS — E785 Hyperlipidemia, unspecified: Secondary | ICD-10-CM

## 2019-03-29 NOTE — Progress Notes (Signed)
I have discussed the procedure for the virtual visit with the patient who has given consent to proceed with assessment and treatment.   Tammey Deeg, CMA     

## 2019-03-29 NOTE — Progress Notes (Signed)
Virtual Visit via Video   I connected with Priscilla Thompson on 03/29/19 at  9:00 AM EDT by a video enabled telemedicine application and verified that I am speaking with the correct person using two identifiers. Location patient: Home Location provider: [facility] [place], Office Persons participating in the virtual visit: Pt and Myself  I discussed the limitations of evaluation and management by telemedicine and the availability of in person appointments. The patient expressed understanding and agreed to proceed.  Subjective:   HPI:  HTN- chronic problem, on Amlodipine 5mg  daily, Lisinopril HCTZ 20/25 daily w/ hx of good control.  Unable to check BP today.  No CP, SOB, HAs, visual changes, edema.  Hyperlipidemia- chronic problem, on Atorvastatin 20mg  daily.  No abd pain, N/V.  DM- diet controlled.  UTD on foot exam, eye exam, and on ACE for renal protection.  Pt is going to get outside and start walking but otherwise limited exercise.  No numbness/tingling of hands/feet.  Hypothyroid- chronic problem, on Levothyroxine 50mcg daily.  Denies fatigue.  No changes to skin/hair/nails.  Obesity- pt is down 4 lbs since last visit.  Pt is happy with this.  'i'm trying'.  Vit D deficiency- due for repeat labs  ROS: See pertinent positives and negatives per HPI.  Patient Active Problem List   Diagnosis Date Noted  . Morbid obesity (HCC) 06/23/2018  . Grief 01/12/2013  . Rhinitis 01/27/2012  . General medical examination 08/18/2011  . HAND PAIN, BILATERAL 02/16/2011  . ANKLE EDEMA 02/16/2011  . Diet-controlled diabetes mellitus (HCC) 02/02/2011  . Hyperlipidemia 02/02/2011  . BACK PAIN 02/01/2009  . Hypothyroidism 01/25/2009  . VITAMIN D DEFICIENCY 01/25/2009  . GLAUCOMA NOS 01/25/2009  . Essential hypertension 01/25/2009    Social History   Tobacco Use  . Smoking status: Never Smoker  . Smokeless tobacco: Never Used  Substance Use Topics  . Alcohol use: Yes    Alcohol/week:  0.0 standard drinks    Comment: socially    Current Outpatient Medications:  .  acetaminophen (TYLENOL) 650 MG CR tablet, Take 1,300 mg by mouth every 8 (eight) hours as needed for pain (for arthritis)., Disp: , Rfl:  .  amLODipine (NORVASC) 5 MG tablet, TAKE 1 TABLET BY MOUTH EVERY DAY, Disp: 90 tablet, Rfl: 1 .  aspirin 81 MG tablet, Take 81 mg by mouth daily., Disp: , Rfl:  .  atorvastatin (LIPITOR) 20 MG tablet, TAKE 1 TABLET BY MOUTH EVERY DAY, Disp: 30 tablet, Rfl: 0 .  Biotin 2500 MCG CAPS, Take 1 capsule by mouth daily., Disp: , Rfl:  .  Calcium Carb-Cholecalciferol (CALCIUM-VITAMIN D) 500-400 MG-UNIT TABS, Take 1 tablet by mouth daily., Disp: , Rfl:  .  Cholecalciferol (VITAMIN D3) 1000 UNITS CAPS, Take 1 capsule by mouth daily., Disp: , Rfl:  .  cyclobenzaprine (FLEXERIL) 5 MG tablet, TAKE 1 TABLET AT BEDTIME AS NEEDED FOR TRAPEZIUS PAIN OR MUSCLE SPASM, Disp: 30 tablet, Rfl: 0 .  diclofenac (VOLTAREN) 50 MG EC tablet, TAKE 1 TABLET BY MOUTH TWICE A DAY, Disp: 60 tablet, Rfl: 0 .  fish oil-omega-3 fatty acids 1000 MG capsule, Take 1,200 mg by mouth 3 (three) times daily. , Disp: , Rfl:  .  latanoprost (XALATAN) 0.005 % ophthalmic solution, Place 1 drop into both eyes at bedtime. , Disp: , Rfl:  .  levothyroxine (SYNTHROID, LEVOTHROID) 50 MCG tablet, TAKE 1 TABLET BY MOUTH EVERY DAY, Disp: 90 tablet, Rfl: 1 .  lisinopril-hydrochlorothiazide (PRINZIDE,ZESTORETIC) 20-25 MG tablet, TAKE 1 TABLET BY  MOUTH EVERY DAY, Disp: 90 tablet, Rfl: 1 .  Multiple Vitamins-Minerals (MULTI FOR HER 50+ PO), Take 2 chew by mouth daily, Disp: , Rfl:  .  omeprazole (PRILOSEC) 20 MG capsule, Take 20 mg by mouth daily., Disp: , Rfl:  .  potassium chloride SA (KLOR-CON M20) 20 MEQ tablet, Take 1 tablet (20 mEq total) by mouth daily., Disp: 90 tablet, Rfl: 1  No Known Allergies  Objective:   Ht 5\' 3"  (1.6 m)   Wt 205 lb (93 kg)   BMI 36.31 kg/m  AAOx3, NAD NCAT, EOMI No obvious CN deficits Coloring  WNL Pt is able to speak clearly, coherently without shortness of breath or increased work of breathing.  Thought process is linear.  Mood is appropriate.   Assessment and Plan:   HTN- chronic problem.  No way to check today but will get reading when she comes for labs.  Hx of good control.  Asymptomatic.  Check labs.  No anticipated med changes.  Will follow.  Hyperlipidemia- chronic problem.  Tolerating statin w/o difficulty.  Check labs.  Adjust meds prn   DM- chronic problem.  Currently diet controlled.  Asymptomatic.  UTD on foot exam, eye exam, and on ACE for renal protection.  Check labs.  Adjust meds prn   Hypothyroid- chronic problem.  Asymptomatic.  Check labs.  Adjust meds prn   Obesity- pt is down 4 lbs.  Encouraged healthy diet and regular exercise while at home.  Will continue to follow.  Vit D deficiency- check labs, replete prn.   Neena Rhymes, MD 03/29/2019

## 2019-03-30 DIAGNOSIS — N958 Other specified menopausal and perimenopausal disorders: Secondary | ICD-10-CM | POA: Diagnosis not present

## 2019-03-30 DIAGNOSIS — Z13820 Encounter for screening for osteoporosis: Secondary | ICD-10-CM | POA: Diagnosis not present

## 2019-03-30 LAB — HM DEXA SCAN: HM Dexa Scan: NORMAL

## 2019-03-31 ENCOUNTER — Other Ambulatory Visit (INDEPENDENT_AMBULATORY_CARE_PROVIDER_SITE_OTHER): Payer: Medicare Other

## 2019-03-31 DIAGNOSIS — E119 Type 2 diabetes mellitus without complications: Secondary | ICD-10-CM

## 2019-03-31 DIAGNOSIS — I10 Essential (primary) hypertension: Secondary | ICD-10-CM

## 2019-03-31 DIAGNOSIS — E785 Hyperlipidemia, unspecified: Secondary | ICD-10-CM | POA: Diagnosis not present

## 2019-03-31 DIAGNOSIS — E559 Vitamin D deficiency, unspecified: Secondary | ICD-10-CM | POA: Diagnosis not present

## 2019-03-31 DIAGNOSIS — E038 Other specified hypothyroidism: Secondary | ICD-10-CM

## 2019-03-31 LAB — CBC WITH DIFFERENTIAL/PLATELET
Basophils Absolute: 0 10*3/uL (ref 0.0–0.1)
Basophils Relative: 0.5 % (ref 0.0–3.0)
Eosinophils Absolute: 0.1 10*3/uL (ref 0.0–0.7)
Eosinophils Relative: 1.3 % (ref 0.0–5.0)
HCT: 38.9 % (ref 36.0–46.0)
Hemoglobin: 12.9 g/dL (ref 12.0–15.0)
Lymphocytes Relative: 38.3 % (ref 12.0–46.0)
Lymphs Abs: 2.7 10*3/uL (ref 0.7–4.0)
MCHC: 33.1 g/dL (ref 30.0–36.0)
MCV: 85.2 fl (ref 78.0–100.0)
Monocytes Absolute: 0.5 10*3/uL (ref 0.1–1.0)
Monocytes Relative: 6.4 % (ref 3.0–12.0)
Neutro Abs: 3.8 10*3/uL (ref 1.4–7.7)
Neutrophils Relative %: 53.5 % (ref 43.0–77.0)
Platelets: 241 10*3/uL (ref 150.0–400.0)
RBC: 4.57 Mil/uL (ref 3.87–5.11)
RDW: 13.7 % (ref 11.5–15.5)
WBC: 7.2 10*3/uL (ref 4.0–10.5)

## 2019-03-31 LAB — BASIC METABOLIC PANEL
BUN: 15 mg/dL (ref 6–23)
CO2: 29 mEq/L (ref 19–32)
Calcium: 9.6 mg/dL (ref 8.4–10.5)
Chloride: 102 mEq/L (ref 96–112)
Creatinine, Ser: 0.91 mg/dL (ref 0.40–1.20)
GFR: 74.26 mL/min (ref >60.00)
Glucose, Bld: 84 mg/dL (ref 70–99)
Potassium: 4.4 mEq/L (ref 3.5–5.1)
Sodium: 141 mEq/L (ref 135–145)

## 2019-03-31 LAB — TSH: TSH: 1.43 u[IU]/mL (ref 0.35–4.50)

## 2019-03-31 LAB — HEMOGLOBIN A1C: Hgb A1c MFr Bld: 6.4 % (ref 4.6–6.5)

## 2019-03-31 LAB — HEPATIC FUNCTION PANEL
ALT: 18 U/L (ref 0–35)
AST: 14 U/L (ref 0–37)
Albumin: 4.3 g/dL (ref 3.5–5.2)
Alkaline Phosphatase: 58 U/L (ref 39–117)
Bilirubin, Direct: 0.1 mg/dL (ref 0.0–0.3)
Total Bilirubin: 0.6 mg/dL (ref 0.2–1.2)
Total Protein: 7.1 g/dL (ref 6.0–8.3)

## 2019-03-31 LAB — LIPID PANEL
Cholesterol: 134 mg/dL (ref 0–200)
HDL: 60.6 mg/dL (ref >39.00)
LDL Cholesterol: 63 mg/dL (ref 0–99)
NonHDL: 72.95
Total CHOL/HDL Ratio: 2
Triglycerides: 50 mg/dL (ref 0.0–149.0)
VLDL: 10 mg/dL (ref 0.0–40.0)

## 2019-03-31 LAB — VITAMIN D 25 HYDROXY (VIT D DEFICIENCY, FRACTURES): VITD: 57.66 ng/mL (ref 30.00–100.00)

## 2019-04-04 ENCOUNTER — Other Ambulatory Visit: Payer: Self-pay | Admitting: Family Medicine

## 2019-04-11 ENCOUNTER — Encounter: Payer: Medicare Other | Admitting: Family Medicine

## 2019-04-26 ENCOUNTER — Other Ambulatory Visit: Payer: Self-pay | Admitting: Family Medicine

## 2019-04-30 ENCOUNTER — Other Ambulatory Visit: Payer: Self-pay | Admitting: Family Medicine

## 2019-05-18 ENCOUNTER — Other Ambulatory Visit: Payer: Self-pay | Admitting: Family Medicine

## 2019-06-09 ENCOUNTER — Other Ambulatory Visit: Payer: Self-pay | Admitting: Family Medicine

## 2019-07-12 ENCOUNTER — Other Ambulatory Visit: Payer: Self-pay | Admitting: Family Medicine

## 2019-07-17 ENCOUNTER — Ambulatory Visit (INDEPENDENT_AMBULATORY_CARE_PROVIDER_SITE_OTHER): Payer: Medicare Other | Admitting: Family Medicine

## 2019-07-17 ENCOUNTER — Encounter: Payer: Self-pay | Admitting: Family Medicine

## 2019-07-17 ENCOUNTER — Other Ambulatory Visit: Payer: Self-pay

## 2019-07-17 VITALS — BP 126/84 | HR 87 | Temp 98.1°F | Resp 16 | Ht 63.0 in | Wt 208.2 lb

## 2019-07-17 DIAGNOSIS — E1169 Type 2 diabetes mellitus with other specified complication: Secondary | ICD-10-CM

## 2019-07-17 DIAGNOSIS — Z0000 Encounter for general adult medical examination without abnormal findings: Secondary | ICD-10-CM

## 2019-07-17 DIAGNOSIS — E785 Hyperlipidemia, unspecified: Secondary | ICD-10-CM | POA: Diagnosis not present

## 2019-07-17 DIAGNOSIS — E559 Vitamin D deficiency, unspecified: Secondary | ICD-10-CM

## 2019-07-17 LAB — CBC WITH DIFFERENTIAL/PLATELET
Basophils Absolute: 0 10*3/uL (ref 0.0–0.1)
Basophils Relative: 0.6 % (ref 0.0–3.0)
Eosinophils Absolute: 0.2 10*3/uL (ref 0.0–0.7)
Eosinophils Relative: 2.4 % (ref 0.0–5.0)
HCT: 39.1 % (ref 36.0–46.0)
Hemoglobin: 12.9 g/dL (ref 12.0–15.0)
Lymphocytes Relative: 37.6 % (ref 12.0–46.0)
Lymphs Abs: 2.6 10*3/uL (ref 0.7–4.0)
MCHC: 33 g/dL (ref 30.0–36.0)
MCV: 86 fl (ref 78.0–100.0)
Monocytes Absolute: 0.5 10*3/uL (ref 0.1–1.0)
Monocytes Relative: 8 % (ref 3.0–12.0)
Neutro Abs: 3.5 10*3/uL (ref 1.4–7.7)
Neutrophils Relative %: 51.4 % (ref 43.0–77.0)
Platelets: 270 10*3/uL (ref 150.0–400.0)
RBC: 4.55 Mil/uL (ref 3.87–5.11)
RDW: 13.6 % (ref 11.5–15.5)
WBC: 6.8 10*3/uL (ref 4.0–10.5)

## 2019-07-17 LAB — HEPATIC FUNCTION PANEL
ALT: 17 U/L (ref 0–35)
AST: 14 U/L (ref 0–37)
Albumin: 4.4 g/dL (ref 3.5–5.2)
Alkaline Phosphatase: 61 U/L (ref 39–117)
Bilirubin, Direct: 0.1 mg/dL (ref 0.0–0.3)
Total Bilirubin: 0.6 mg/dL (ref 0.2–1.2)
Total Protein: 7.2 g/dL (ref 6.0–8.3)

## 2019-07-17 LAB — LIPID PANEL
Cholesterol: 128 mg/dL (ref 0–200)
HDL: 55.1 mg/dL (ref >39.00)
LDL Cholesterol: 62 mg/dL (ref 0–99)
NonHDL: 73.18
Total CHOL/HDL Ratio: 2
Triglycerides: 57 mg/dL (ref 0.0–149.0)
VLDL: 11.4 mg/dL (ref 0.0–40.0)

## 2019-07-17 LAB — BASIC METABOLIC PANEL
BUN: 15 mg/dL (ref 6–23)
CO2: 28 mEq/L (ref 19–32)
Calcium: 9.6 mg/dL (ref 8.4–10.5)
Chloride: 102 mEq/L (ref 96–112)
Creatinine, Ser: 0.95 mg/dL (ref 0.40–1.20)
GFR: 70.6 mL/min (ref >60.00)
Glucose, Bld: 86 mg/dL (ref 70–99)
Potassium: 3.8 mEq/L (ref 3.5–5.1)
Sodium: 139 mEq/L (ref 135–145)

## 2019-07-17 LAB — HEMOGLOBIN A1C: Hgb A1c MFr Bld: 6.3 % (ref 4.6–6.5)

## 2019-07-17 LAB — VITAMIN D 25 HYDROXY (VIT D DEFICIENCY, FRACTURES): VITD: 61.61 ng/mL (ref 30.00–100.00)

## 2019-07-17 LAB — TSH: TSH: 2 u[IU]/mL (ref 0.35–4.50)

## 2019-07-17 NOTE — Assessment & Plan Note (Signed)
Check labs and replete prn. 

## 2019-07-17 NOTE — Assessment & Plan Note (Signed)
Pt's PE WNL w/ exception of obesity.  UTD on colonoscopy, mammo scheduled.  Foot exam done today.  Check labs.  Anticipatory guidance provided.

## 2019-07-17 NOTE — Progress Notes (Signed)
   Subjective:    Patient ID: Priscilla Thompson, female    DOB: February 26, 1951, 68 y.o.   MRN: [id number]  HPI CPE- UTD on DEXA, mammo is scheduled.  UTD on colonoscopy.  Due for foot exam.  On ACE for renal protection, due for eye exam in September   Review of Systems Patient reports no vision/ hearing changes, adenopathy,fever, weight change,  persistant/recurrent hoarseness , swallowing issues, chest pain, palpitations, edema, persistant/recurrent cough, hemoptysis, dyspnea (rest/exertional/paroxysmal nocturnal), gastrointestinal bleeding (melena, rectal bleeding), abdominal pain, significant heartburn, bowel changes, GU symptoms (dysuria, hematuria, incontinence), Gyn symptoms (abnormal  bleeding, pain),  syncope, focal weakness, memory loss, numbness & tingling, skin/hair/nail changes, abnormal bruising or bleeding, anxiety, or depression.     Objective:   Physical Exam General Appearance:    Alert, cooperative, no distress, appears stated age, obese  Head:    Normocephalic, without obvious abnormality, atraumatic  Eyes:    PERRL, conjunctiva/corneas clear, EOM's intact, fundi    benign, both eyes  Ears:    Normal TM's and external ear canals, both ears  Nose:   Nares normal, septum midline, mucosa normal, no drainage    or sinus tenderness  Throat:   Lips, mucosa, and tongue normal; teeth and gums normal  Neck:   Supple, symmetrical, trachea midline, no adenopathy;    Thyroid: no enlargement/tenderness/nodules  Back:     Symmetric, no curvature, ROM normal, no CVA tenderness  Lungs:     Clear to auscultation bilaterally, respirations unlabored  Chest Wall:    No tenderness or deformity   Heart:    Regular rate and rhythm, S1 and S2 normal, no murmur, rub   or gallop  Breast Exam:    Deferred to GYN  Abdomen:     Soft, non-tender, bowel sounds active all four quadrants,    no masses, no organomegaly  Genitalia:    Deferred to GYN  Rectal:    Extremities:   Extremities normal,  atraumatic, no cyanosis or edema  Pulses:   2+ and symmetric all extremities  Skin:   Skin color, texture, turgor normal, no rashes or lesions  Lymph nodes:   Cervical, supraclavicular, and axillary nodes normal  Neurologic:   CNII-XII intact, normal strength, sensation and reflexes    throughout          Assessment & Plan:

## 2019-07-17 NOTE — Assessment & Plan Note (Signed)
Chronic problem, tolerating statin w/o difficulty.  Check labs.  Will follow.

## 2019-07-17 NOTE — Patient Instructions (Addendum)
Follow up in 3-4 months to recheck diabetes We'll notify you of your lab results and make any changes if needed Continue to work on healthy diet and regular exercise- you can do it! Schedule your eye exam for this fall Call with any questions or concerns Stay Safe!!!

## 2019-08-02 ENCOUNTER — Encounter: Payer: Self-pay | Admitting: Family Medicine

## 2019-08-02 ENCOUNTER — Telehealth: Payer: Self-pay

## 2019-08-02 ENCOUNTER — Other Ambulatory Visit: Payer: Self-pay

## 2019-08-02 NOTE — Telephone Encounter (Signed)
Spoke with patient after getting a MyChart message requesting a refill for FLEXERIL 5 mg. Patient stated that she thinks she has pulled a muscle in her arm after lifting 24 pack of green tea at [employer], this has happened in the past, possibly in 2017. Requesting refill sent to [facility] on  [address].  Patient has not had medication refilled since 08/2018, routed to PCP for approval.

## 2019-08-03 ENCOUNTER — Other Ambulatory Visit: Payer: Self-pay

## 2019-08-03 MED ORDER — CYCLOBENZAPRINE HCL 5 MG PO TABS: 5.0000 mg | ORAL_TABLET | Freq: Three times a day (TID) | ORAL | 0 refills | Status: DC | PRN

## 2019-08-03 NOTE — Telephone Encounter (Signed)
Ok for Flexeril 5mg  TID prn #30 no refills

## 2019-08-03 NOTE — Telephone Encounter (Signed)
Script sent to pharmacy.

## 2019-08-10 ENCOUNTER — Other Ambulatory Visit: Payer: Self-pay | Admitting: General Practice

## 2019-08-10 MED ORDER — ATORVASTATIN CALCIUM 20 MG PO TABS: 20.0000 mg | ORAL_TABLET | Freq: Every day | ORAL | 0 refills | Status: DC

## 2019-08-29 DIAGNOSIS — H04123 Dry eye syndrome of bilateral lacrimal glands: Secondary | ICD-10-CM | POA: Diagnosis not present

## 2019-08-29 DIAGNOSIS — H40023 Open angle with borderline findings, high risk, bilateral: Secondary | ICD-10-CM | POA: Diagnosis not present

## 2019-08-29 DIAGNOSIS — E119 Type 2 diabetes mellitus without complications: Secondary | ICD-10-CM | POA: Diagnosis not present

## 2019-08-29 DIAGNOSIS — H2513 Age-related nuclear cataract, bilateral: Secondary | ICD-10-CM | POA: Diagnosis not present

## 2019-08-29 LAB — HM DIABETES EYE EXAM

## 2019-09-07 ENCOUNTER — Encounter: Payer: Self-pay | Admitting: General Practice

## 2019-09-25 ENCOUNTER — Encounter: Payer: Self-pay | Admitting: Family Medicine

## 2019-09-25 ENCOUNTER — Ambulatory Visit (INDEPENDENT_AMBULATORY_CARE_PROVIDER_SITE_OTHER): Payer: Medicare Other | Admitting: Family Medicine

## 2019-09-25 ENCOUNTER — Other Ambulatory Visit: Payer: Self-pay

## 2019-09-25 VITALS — BP 130/84 | HR 81 | Temp 97.9°F | Resp 16 | Ht 63.0 in | Wt 210.4 lb

## 2019-09-25 DIAGNOSIS — E119 Type 2 diabetes mellitus without complications: Secondary | ICD-10-CM

## 2019-09-25 LAB — BASIC METABOLIC PANEL
BUN: 13 mg/dL (ref 6–23)
CO2: 28 mEq/L (ref 19–32)
Calcium: 9.6 mg/dL (ref 8.4–10.5)
Chloride: 101 mEq/L (ref 96–112)
Creatinine, Ser: 0.92 mg/dL (ref 0.40–1.20)
GFR: 73.22 mL/min (ref >60.00)
Glucose, Bld: 93 mg/dL (ref 70–99)
Potassium: 3.9 mEq/L (ref 3.5–5.1)
Sodium: 139 mEq/L (ref 135–145)

## 2019-09-25 LAB — HEMOGLOBIN A1C: Hgb A1c MFr Bld: 6.6 % — ABNORMAL HIGH (ref 4.6–6.5)

## 2019-09-25 NOTE — Assessment & Plan Note (Signed)
Chronic problem.  Has been diet controlled.  Admits to sweet tooth but is trying to control.  UTD on foot exam, eye exam, on ACE for renal protection.  Check labs.  Start meds if needed.

## 2019-09-25 NOTE — Progress Notes (Signed)
   Subjective:    Patient ID: Priscilla Thompson, female    DOB: [birth year], 68 y.o.   MRN: [id number]  HPI DM- chronic problem, currently diet controlled.  UTD on foot exam, eye exam.  On ACE for renal protection.  Walking 3x/week.  Cooking at home rather than eating out.  Home CBGs 110s.  No CP, SOB, HAs, visual changes, abd pain, N/V.  No numbness/tingling of hands/feet.   Review of Systems For ROS see HPI     Objective:   Physical Exam Vitals signs reviewed.  Constitutional:      General: She is not in acute distress.    Appearance: She is well-developed. She is obese.  HENT:     Head: Normocephalic and atraumatic.  Eyes:     Conjunctiva/sclera: Conjunctivae normal.     Pupils: Pupils are equal, round, and reactive to light.  Neck:     Musculoskeletal: Normal range of motion and neck supple.     Thyroid: No thyromegaly.  Cardiovascular:     Rate and Rhythm: Normal rate and regular rhythm.     Heart sounds: Normal heart sounds. No murmur.  Pulmonary:     Effort: Pulmonary effort is normal. No respiratory distress.     Breath sounds: Normal breath sounds.  Abdominal:     General: There is no distension.     Palpations: Abdomen is soft.     Tenderness: There is no abdominal tenderness.  Lymphadenopathy:     Cervical: No cervical adenopathy.  Skin:    General: Skin is warm and dry.  Neurological:     Mental Status: She is alert and oriented to person, place, and time.  Psychiatric:        Behavior: Behavior normal.           Assessment & Plan:

## 2019-09-25 NOTE — Patient Instructions (Signed)
Follow up in 6 months to recheck DM, BP, and cholesterol We'll notify you of your lab results and make any changes if needed Continue to work on healthy diet and regular exercise- you're doing great! Call with any questions or concerns Stay Safe!  Stay Healthy!!!

## 2019-09-28 DIAGNOSIS — Z1231 Encounter for screening mammogram for malignant neoplasm of breast: Secondary | ICD-10-CM | POA: Diagnosis not present

## 2019-09-28 DIAGNOSIS — Z124 Encounter for screening for malignant neoplasm of cervix: Secondary | ICD-10-CM | POA: Diagnosis not present

## 2019-09-28 LAB — HM MAMMOGRAPHY

## 2019-10-04 ENCOUNTER — Encounter: Payer: Self-pay | Admitting: General Practice

## 2019-10-04 ENCOUNTER — Encounter: Payer: Self-pay | Admitting: Family Medicine

## 2019-10-24 ENCOUNTER — Other Ambulatory Visit: Payer: Self-pay | Admitting: Family Medicine

## 2019-11-28 ENCOUNTER — Other Ambulatory Visit: Payer: Self-pay | Admitting: Family Medicine

## 2019-12-02 ENCOUNTER — Other Ambulatory Visit: Payer: Self-pay | Admitting: Family Medicine

## 2019-12-30 ENCOUNTER — Other Ambulatory Visit: Payer: Self-pay | Admitting: Family Medicine

## 2020-01-12 ENCOUNTER — Encounter: Payer: Self-pay | Admitting: Family Medicine

## 2020-01-12 ENCOUNTER — Other Ambulatory Visit: Payer: Self-pay | Admitting: Family Medicine

## 2020-01-12 MED ORDER — ATORVASTATIN CALCIUM 20 MG PO TABS: 20.0000 mg | ORAL_TABLET | Freq: Every day | ORAL | 0 refills | Status: DC

## 2020-02-03 ENCOUNTER — Ambulatory Visit: Payer: Medicare Other

## 2020-02-20 ENCOUNTER — Encounter: Payer: Self-pay | Admitting: Family Medicine

## 2020-02-21 MED ORDER — DICLOFENAC SODIUM 50 MG PO TBEC: 50.0000 mg | DELAYED_RELEASE_TABLET | Freq: Two times a day (BID) | ORAL | 1 refills | Status: DC

## 2020-02-26 ENCOUNTER — Ambulatory Visit: Payer: Medicare Other | Attending: Internal Medicine

## 2020-02-26 DIAGNOSIS — Z23 Encounter for immunization: Secondary | ICD-10-CM

## 2020-02-26 NOTE — Progress Notes (Signed)
   Covid-19 Vaccination Clinic  Name:  Priscilla Thompson    MRN: [id number] DOB: August 13, 1951  02/26/2020  Priscilla Thompson was observed post Covid-19 immunization for 15 minutes without incident. She was provided with Vaccine Information Sheet and instruction to access the V-Safe system.   Priscilla Thompson was instructed to call 911 with any severe reactions post vaccine: Marland Kitchen Difficulty breathing  . Swelling of face and throat  . A fast heartbeat  . A bad rash all over body  . Dizziness and weakness   Immunizations Administered    Name Date Dose VIS Date Route   Pfizer COVID-19 Vaccine 02/26/2020  8:47 AM 0.3 mL 11/24/2019 Intramuscular   Manufacturer: [employer], [employer]   Lot: [id number]   NDC: [address]-0

## 2020-03-18 ENCOUNTER — Telehealth: Payer: Self-pay | Admitting: Family Medicine

## 2020-03-18 NOTE — Progress Notes (Signed)
  Chronic Care Management   Note  03/18/2020 Name: Priscilla Thompson MRN: [id number] DOB: [birth year]  Priscilla Thompson is a 69 y.o. year old female who is a primary care patient of Beverely Low, Helane Rima, MD. I reached out to Priscilla Thompson by phone today in response to a referral sent by Priscilla Thompson's PCP, Priscilla Hatch, MD.   Priscilla Thompson was given information about Chronic Care Management services today including:  1. CCM service includes personalized support from designated clinical staff supervised by her physician, including individualized plan of care and coordination with other care providers 2. 24/7 contact phone numbers for assistance for urgent and routine care needs. 3. Service will only be billed when office clinical staff spend 20 minutes or more in a month to coordinate care. 4. Only one practitioner may furnish and bill the service in a calendar month. 5. The patient may stop CCM services at any time (effective at the end of the month) by phone call to the office staff.   Patient agreed to services and verbal consent obtained.   Follow up plan:   Priscilla Thompson Upstream Scheduler

## 2020-03-18 NOTE — Progress Notes (Signed)
°  Chronic Care Management   Outreach Note  03/18/2020 Name: Priscilla Thompson MRN: [id number] DOB: 1951/04/12  Referred by: Sheliah Hatch, MD Reason for referral : No chief complaint on file.   An unsuccessful telephone outreach was attempted today. The patient was referred to the pharmacist for assistance with care management and care coordination.   Follow Up Plan:   Lynnae January Upstream Scheduler

## 2020-03-20 ENCOUNTER — Ambulatory Visit: Payer: Medicare Other | Attending: Internal Medicine

## 2020-03-20 DIAGNOSIS — Z23 Encounter for immunization: Secondary | ICD-10-CM

## 2020-03-20 NOTE — Progress Notes (Signed)
   Covid-19 Vaccination Clinic  Name:  ROSCHELLE CALANDRA    MRN: [id number] DOB: 1951-02-08  03/20/2020  Ms. Zuba was observed post Covid-19 immunization for 15 minutes without incident. She was provided with Vaccine Information Sheet and instruction to access the V-Safe system.   Ms. Tiggs was instructed to call 911 with any severe reactions post vaccine: Marland Kitchen Difficulty breathing  . Swelling of face and throat  . A fast heartbeat  . A bad rash all over body  . Dizziness and weakness   Immunizations Administered    Name Date Dose VIS Date Route   Pfizer COVID-19 Vaccine 03/20/2020 12:44 PM 0.3 mL 11/24/2019 Intramuscular   Manufacturer: [employer], [employer]   Lot: [id number]   NDC: [address]-8

## 2020-03-25 ENCOUNTER — Encounter: Payer: Self-pay | Admitting: Family Medicine

## 2020-03-25 ENCOUNTER — Other Ambulatory Visit: Payer: Self-pay

## 2020-03-25 ENCOUNTER — Encounter: Payer: Self-pay | Admitting: General Practice

## 2020-03-25 ENCOUNTER — Ambulatory Visit (INDEPENDENT_AMBULATORY_CARE_PROVIDER_SITE_OTHER): Payer: Medicare Other | Admitting: Family Medicine

## 2020-03-25 VITALS — BP 122/81 | HR 96 | Temp 98.0°F | Resp 17 | Ht 63.0 in | Wt 196.4 lb

## 2020-03-25 DIAGNOSIS — E119 Type 2 diabetes mellitus without complications: Secondary | ICD-10-CM

## 2020-03-25 DIAGNOSIS — E785 Hyperlipidemia, unspecified: Secondary | ICD-10-CM

## 2020-03-25 DIAGNOSIS — I10 Essential (primary) hypertension: Secondary | ICD-10-CM

## 2020-03-25 DIAGNOSIS — E038 Other specified hypothyroidism: Secondary | ICD-10-CM | POA: Diagnosis not present

## 2020-03-25 DIAGNOSIS — E1169 Type 2 diabetes mellitus with other specified complication: Secondary | ICD-10-CM | POA: Diagnosis not present

## 2020-03-25 LAB — CBC WITH DIFFERENTIAL/PLATELET
Basophils Absolute: 0.1 10*3/uL (ref 0.0–0.1)
Basophils Relative: 1.2 % (ref 0.0–3.0)
Eosinophils Absolute: 0.1 10*3/uL (ref 0.0–0.7)
Eosinophils Relative: 1.4 % (ref 0.0–5.0)
HCT: 39.1 % (ref 36.0–46.0)
Hemoglobin: 12.9 g/dL (ref 12.0–15.0)
Lymphocytes Relative: 31 % (ref 12.0–46.0)
Lymphs Abs: 2.1 10*3/uL (ref 0.7–4.0)
MCHC: 33.1 g/dL (ref 30.0–36.0)
MCV: 86 fl (ref 78.0–100.0)
Monocytes Absolute: 0.5 10*3/uL (ref 0.1–1.0)
Monocytes Relative: 7.7 % (ref 3.0–12.0)
Neutro Abs: 3.9 10*3/uL (ref 1.4–7.7)
Neutrophils Relative %: 58.7 % (ref 43.0–77.0)
Platelets: 238 10*3/uL (ref 150.0–400.0)
RBC: 4.54 Mil/uL (ref 3.87–5.11)
RDW: 14.3 % (ref 11.5–15.5)
WBC: 6.7 10*3/uL (ref 4.0–10.5)

## 2020-03-25 LAB — TSH: TSH: 1.36 u[IU]/mL (ref 0.35–4.50)

## 2020-03-25 LAB — LIPID PANEL
Cholesterol: 140 mg/dL (ref 0–200)
HDL: 59.3 mg/dL (ref >39.00)
LDL Cholesterol: 68 mg/dL (ref 0–99)
NonHDL: 80.34
Total CHOL/HDL Ratio: 2
Triglycerides: 62 mg/dL (ref 0.0–149.0)
VLDL: 12.4 mg/dL (ref 0.0–40.0)

## 2020-03-25 LAB — HEPATIC FUNCTION PANEL
ALT: 23 U/L (ref 0–35)
AST: 16 U/L (ref 0–37)
Albumin: 4.4 g/dL (ref 3.5–5.2)
Alkaline Phosphatase: 63 U/L (ref 39–117)
Bilirubin, Direct: 0.1 mg/dL (ref 0.0–0.3)
Total Bilirubin: 0.6 mg/dL (ref 0.2–1.2)
Total Protein: 7.2 g/dL (ref 6.0–8.3)

## 2020-03-25 LAB — BASIC METABOLIC PANEL
BUN: 13 mg/dL (ref 6–23)
CO2: 31 mEq/L (ref 19–32)
Calcium: 9.7 mg/dL (ref 8.4–10.5)
Chloride: 102 mEq/L (ref 96–112)
Creatinine, Ser: 0.8 mg/dL (ref 0.40–1.20)
GFR: 85.91 mL/min (ref >60.00)
Glucose, Bld: 102 mg/dL — ABNORMAL HIGH (ref 70–99)
Potassium: 3.9 mEq/L (ref 3.5–5.1)
Sodium: 142 mEq/L (ref 135–145)

## 2020-03-25 LAB — HEMOGLOBIN A1C: Hgb A1c MFr Bld: 6.2 % (ref 4.6–6.5)

## 2020-03-25 NOTE — Progress Notes (Signed)
Chronic Care Management Pharmacy  Name: Priscilla Thompson  MRN: [id number] DOB: September 28, 1951  Chief Complaint/ HPI  Priscilla Thompson,  69 y.o. , female presents for their Initial CCM visit with the clinical pharmacist via telephone due to COVID-19 Pandemic.  PCP : Priscilla Minium, MD  Their chronic conditions include: HTN, HoTR, DM, HLD   Office Visits:  4/12 (PCP): DM f/u. No medication changes.   Outpatient Encounter Medications as of 03/26/2020  Medication Sig  . acetaminophen (TYLENOL) 650 MG CR tablet Take 1,300 mg by mouth every 8 (eight) hours as needed for pain (for arthritis).  Marland Kitchen amLODipine (NORVASC) 5 MG tablet TAKE 1 TABLET BY MOUTH EVERY DAY  . aspirin 81 MG tablet Take 81 mg by mouth daily.  Marland Kitchen atorvastatin (LIPITOR) 20 MG tablet Take 1 tablet (20 mg total) by mouth daily.  . Calcium Carb-Cholecalciferol (CALCIUM-VITAMIN D) 500-400 MG-UNIT TABS Take 1 tablet by mouth daily.  . Cholecalciferol (VITAMIN D3) 1000 UNITS CAPS Take 1 capsule by mouth daily.  . cyclobenzaprine (FLEXERIL) 5 MG tablet Take 1 tablet (5 mg total) by mouth 3 (three) times daily as needed for muscle spasms.  . diclofenac (VOLTAREN) 50 MG EC tablet Take 1 tablet (50 mg total) by mouth 2 (two) times daily.  . fish oil-omega-3 fatty acids 1000 MG capsule Take 1,000 mg by mouth daily.   Marland Kitchen KLOR-CON M20 20 MEQ tablet TAKE 1 TABLET BY MOUTH EVERY DAY  . latanoprost (XALATAN) 0.005 % ophthalmic solution Place 1 drop into both eyes at bedtime.   Marland Kitchen levothyroxine (SYNTHROID) 50 MCG tablet TAKE 1 TABLET BY MOUTH EVERY DAY  . lisinopril-hydrochlorothiazide (ZESTORETIC) 20-25 MG tablet TAKE 1 TABLET BY MOUTH EVERY DAY  . Multiple Vitamins-Minerals (MULTI FOR HER 50+ PO) Take 2 chew by mouth daily  . omeprazole (PRILOSEC) 20 MG capsule Take 20 mg by mouth 3 (three) times a week.   . Biotin 2500 MCG CAPS Take 1 capsule by mouth daily.   No facility-administered encounter medications on file as of 03/26/2020.    Current Diagnosis/Assessment:  Goals Addressed            This Visit's Progress   . PharmD Care Plan       CARE PLAN ENTRY  Current Barriers:  . Chronic Disease Management support, education, and care coordination needs related to: hypertension, hyperlipidemia, diabetes   Pharmacist Clinical Goal(s):  Marland Kitchen Over next six months, maintain: BP <140/90, A1c <7%, TSH 0.35-4.5   Interventions: . Comprehensive medication review performed. . Discussed purposes of medications   Patient Self Care Activities:  . Keep up the good work with diet and exercise!   I enjoyed talking with you - please call with any questions!  Initial goal documentation      Diabetes   Recent Relevant Labs: Lab Results  Component Value Date/Time   HGBA1C 6.2 03/25/2020 09:05 AM   HGBA1C 6.6 (H) 09/25/2019 09:07 AM    Checking BG: n/a Recent FBG Readings: n/a Recent pre-meal BG readings: n/a  Recent 2hr PP BG readings:  n/a Recent HS BG readings: n/a Patient has failed these meds in past: n/a  A1c down from October 2020. At goal <6.5%. Patient is currently controlled on diet alone. Checks carbs and sodium on labels.    Last diabetic eye exam:  Lab Results  Component Value Date/Time   HMDIABEYEEXA No Retinopathy 08/29/2019 12:00 AM    Plan Continue current medications and control with diet and exercise and  Hypertension  BP today is:  <130/80  Office blood pressures are  BP Readings from Last 3 Encounters:  03/25/20 122/81  09/25/19 130/84  07/17/19 126/84   Patient is currently controlled on the following medications: amlodipine 5mg , lisinopril-hctz 20-25mg  daily. Denies dizziness, SOB, chest pain. Patient checks BP at home every 2-4 weeks. Patient home BP readings are ranging: 120s/80s-130s/80s. We discussed dietary recommendations with focus on reducing salt and carbohydrates.  Plan Continue current medications and control with diet and exercise.   Hyperlipidemia   Lipid Panel      Component Value Date/Time   CHOL 140 03/25/2020 0905   TRIG 62.0 03/25/2020 0905   HDL 59.30 03/25/2020 0905   CHOLHDL 2 03/25/2020 0905   VLDL 12.4 03/25/2020 0905   LDLCALC 68 03/25/2020 0905   LDLDIRECT 131.4 08/18/2011 0832    The 10-year ASCVD risk score (Goff DC Jr., et al., 2013) is: 17.1%   Values used to calculate the score:     Age: 14 years     Sex: Female     Is Non-Hispanic African American: Yes     Diabetic: Yes     Tobacco smoker: No     Systolic Blood Pressure: 122 mmHg     Is BP treated: Yes     HDL Cholesterol: 59.3 mg/dL     Total Cholesterol: 140 mg/dL   Patient has failed these meds in past: n/a. Patient is currently controlled on the following medications: atorvastatin 20 mg daily, fish oil 100 mg daily. Denies any muscle or abdominal pain or n/v. Counseled on purpose of medication. Patient expresses understanding. Also taking aspirin 81 mg for CVD prevention. Denies any abnormal bruising, bleeding from nose or gums or blood in urine or stool.  Plan Continue current medications and control with diet and exercise.   Hypothyroidism   TSH  Date Value Ref Range Status  03/25/2020 1.36 0.35 - 4.50 uIU/mL Final   Patient is currently controlled on the following medications: levothyroxine 05/25/2020 daily. We discussed proper administration technique. Denies feeling tired or sluggish. No side effects reported at this time.   Plan Continue current medications.   GERD  Patient denies GERD associated symptoms including dysphagia, heartburn or nausea over the past month. Currently takes omeprazole (Prilosec) 20 mg three times a week.  Expresses understanding to avoid triggers such as tomato sauce.  Plan  Continue current medication.   Visit Information Priscilla Thompson was given information about Chronic Care Management services today including:  1. CCM service includes personalized support from designated clinical staff supervised by her physician, including  individualized plan of care and coordination with other care providers 2. 24/7 contact phone numbers for assistance for urgent and routine care needs. 3. Standard insurance, coinsurance, copays and deductibles apply for chronic care management only during months in which we provide at least 20 minutes of these services. Most insurances cover these services at 100%, however patients may be responsible for any copay, coinsurance and/or deductible if applicable. This service may help you avoid the need for more expensive face-to-face services. 4. Only one practitioner may furnish and bill the service in a calendar month. 5. The patient may stop CCM services at any time (effective at the end of the month) by phone call to the office staff.  Patient agreed to services and verbal consent obtained.   Laural Benes, Pharm.D. Clinical Pharmacist [facility] [phone]

## 2020-03-25 NOTE — Assessment & Plan Note (Signed)
Chronic problem.  Well controlled.  Asymptomatic.  Check labs.  No anticipated med changes.  Will follow. 

## 2020-03-25 NOTE — Patient Instructions (Signed)
Follow up in 3-4 months to recheck diabetes We'll notify you of your lab results and make any changes if needed Continue to work on healthy diet and regular exercise- you're doing great! Call with any questions or concerns Stay Safe!  Stay Healthy! 

## 2020-03-25 NOTE — Patient Instructions (Addendum)
Visit Information  Goals Addressed            This Visit's Progress   . PharmD Care Plan       CARE PLAN ENTRY  Current Barriers:  . Chronic Disease Management support, education, and care coordination needs related to: hypertension, hyperlipidemia, diabetes   Pharmacist Clinical Goal(s):  Marland Kitchen Over next six months, maintain: BP <140/90, A1c <7%, TSH 0.35-4.5   Interventions: . Comprehensive medication review performed. . Discussed purposes of medications   Patient Self Care Activities:  . Keep up the good work with diet and exercise!   I enjoyed talking with you - please call with any questions!  Initial goal documentation      Priscilla Thompson was given information about Chronic Care Management services today including:  1. CCM service includes personalized support from designated clinical staff supervised by her physician, including individualized plan of care and coordination with other care providers 2. 24/7 contact phone numbers for assistance for urgent and routine care needs. 3. Standard insurance, coinsurance, copays and deductibles apply for chronic care management only during months in which we provide at least 20 minutes of these services. Most insurances cover these services at 100%, however patients may be responsible for any copay, coinsurance and/or deductible if applicable. This service may help you avoid the need for more expensive face-to-face services. 4. Only one practitioner may furnish and bill the service in a calendar month. 5. The patient may stop CCM services at any time (effective at the end of the month) by phone call to the office staff.  Patient agreed to services and verbal consent obtained.   The patient verbalized understanding of instructions provided today and agreed to receive a mailed copy of patient instruction and/or educational materials. Face to Face appointment with pharmacist scheduled for:  See next appointment with "Care Management Staff"  under "What's Next" below.   Thank you!  Dahlia Byes, Pharm.D. Clinical Pharmacist [facility] [phone] High Cholesterol  High cholesterol is a condition in which the blood has high levels of a white, waxy, fat-like substance (cholesterol). The human body needs small amounts of cholesterol. The liver makes all the cholesterol that the body needs. Extra (excess) cholesterol comes from the food that we eat. Cholesterol is carried from the liver by the blood through the blood vessels. If you have high cholesterol, deposits (plaques) may build up on the walls of your blood vessels (arteries). Plaques make the arteries narrower and stiffer. Cholesterol plaques increase your risk for heart attack and stroke. Work with your health care provider to keep your cholesterol levels in a healthy range. What increases the risk? This condition is more likely to develop in people who:  Eat foods that are high in animal fat (saturated fat) or cholesterol.  Are overweight.  Are not getting enough exercise.  Have a family history of high cholesterol. What are the signs or symptoms? There are no symptoms of this condition. How is this diagnosed? This condition may be diagnosed from the results of a blood test.  If you are older than age 28, your health care provider may check your cholesterol every 4-6 years.  You may be checked more often if you already have high cholesterol or other risk factors for heart disease. The blood test for cholesterol measures:  "Bad" cholesterol (LDL cholesterol). This is the main type of cholesterol that causes heart disease. The desired level for LDL is less than 100.  "Good"  cholesterol (HDL cholesterol). This type helps to protect against heart disease by cleaning the arteries and carrying the LDL away. The desired level for HDL is 60 or higher.  Triglycerides. These are fats that the body can store or burn for energy. The desired  number for triglycerides is lower than 150.  Total cholesterol. This is a measure of the total amount of cholesterol in your blood, including LDL cholesterol, HDL cholesterol, and triglycerides. A healthy number is less than 200. How is this treated? This condition is treated with diet changes, lifestyle changes, and medicines. Diet changes  This may include eating more whole grains, fruits, vegetables, nuts, and fish.  This may also include cutting back on red meat and foods that have a lot of added sugar. Lifestyle changes  Changes may include getting at least 40 minutes of aerobic exercise 3 times a week. Aerobic exercises include walking, biking, and swimming. Aerobic exercise along with a healthy diet can help you maintain a healthy weight.  Changes may also include quitting smoking. Medicines  Medicines are usually given if diet and lifestyle changes have failed to reduce your cholesterol to healthy levels.  Your health care provider may prescribe a statin medicine. Statin medicines have been shown to reduce cholesterol, which can reduce the risk of heart disease. Follow these instructions at home: Eating and drinking If told by your health care provider:  Eat chicken (without skin), fish, veal, shellfish, ground [place] breast, and round or loin cuts of red meat.  Do not eat fried foods or fatty meats, such as hot dogs and salami.  Eat plenty of fruits, such as apples.  Eat plenty of vegetables, such as broccoli, potatoes, and carrots.  Eat beans, peas, and lentils.  Eat grains such as barley, rice, couscous, and bulgur wheat.  Eat pasta without cream sauces.  Use skim or nonfat milk, and eat low-fat or nonfat yogurt and cheeses.  Do not eat or drink whole milk, cream, ice cream, egg yolks, or hard cheeses.  Do not eat stick margarine or tub margarines that contain trans fats (also called partially hydrogenated oils).  Do not eat saturated tropical oils, such as  coconut oil and palm oil.  Do not eat cakes, cookies, crackers, or other baked goods that contain trans fats.  General instructions  Exercise as directed by your health care provider. Increase your activity level with activities such as gardening, walking, and taking the stairs.  Take over-the-counter and prescription medicines only as told by your health care provider.  Do not use any products that contain nicotine or tobacco, such as cigarettes and e-cigarettes. If you need help quitting, ask your health care provider.  Keep all follow-up visits as told by your health care provider. This is important. Contact a health care provider if:  You are struggling to maintain a healthy diet or weight.  You need help to start on an exercise program.  You need help to stop smoking. Get help right away if:  You have chest pain.  You have trouble breathing. This information is not intended to replace advice given to you by your health care provider. Make sure you discuss any questions you have with your health care provider. Document Revised: 12/03/2017 Document Reviewed: 05/30/2016 Elsevier Patient Education  2020 [employer]. Diabetes Mellitus and Nutrition, Adult When you have diabetes (diabetes mellitus), it is very important to have healthy eating habits because your blood sugar (glucose) levels are greatly affected by what you eat and drink.  Eating healthy foods in the appropriate amounts, at about the same times every day, can help you:  Control your blood glucose.  Lower your risk of heart disease.  Improve your blood pressure.  Reach or maintain a healthy weight. Every person with diabetes is different, and each person has different needs for a meal plan. Your health care provider may recommend that you work with a diet and nutrition specialist (dietitian) to make a meal plan that is best for you. Your meal plan may vary depending on factors such as:  The calories you  need.  The medicines you take.  Your weight.  Your blood glucose, blood pressure, and cholesterol levels.  Your activity level.  Other health conditions you have, such as heart or kidney disease. How do carbohydrates affect me? Carbohydrates, also called carbs, affect your blood glucose level more than any other type of food. Eating carbs naturally raises the amount of glucose in your blood. Carb counting is a method for keeping track of how many carbs you eat. Counting carbs is important to keep your blood glucose at a healthy level, especially if you use insulin or take certain oral diabetes medicines. It is important to know how many carbs you can safely have in each meal. This is different for every person. Your dietitian can help you calculate how many carbs you should have at each meal and for each snack. Foods that contain carbs include:  Bread, cereal, rice, pasta, and crackers.  Potatoes and corn.  Peas, beans, and lentils.  Milk and yogurt.  Fruit and juice.  Desserts, such as cakes, cookies, ice cream, and candy. How does alcohol affect me? Alcohol can cause a sudden decrease in blood glucose (hypoglycemia), especially if you use insulin or take certain oral diabetes medicines. Hypoglycemia can be a life-threatening condition. Symptoms of hypoglycemia (sleepiness, dizziness, and confusion) are similar to symptoms of having too much alcohol. If your health care provider says that alcohol is safe for you, follow these guidelines:  Limit alcohol intake to no more than 1 drink per day for nonpregnant women and 2 drinks per day for men. One drink equals 12 oz of beer, 5 oz of wine, or 1 oz of hard liquor.  Do not drink on an empty stomach.  Keep yourself hydrated with water, diet soda, or unsweetened iced tea.  Keep in mind that regular soda, juice, and other mixers may contain a lot of sugar and must be counted as carbs. What are tips for following this plan?  Reading  food labels  Start by checking the serving size on the "Nutrition Facts" label of packaged foods and drinks. The amount of calories, carbs, fats, and other nutrients listed on the label is based on one serving of the item. Many items contain more than one serving per package.  Check the total grams (g) of carbs in one serving. You can calculate the number of servings of carbs in one serving by dividing the total carbs by 15. For example, if a food has 30 g of total carbs, it would be equal to 2 servings of carbs.  Check the number of grams (g) of saturated and trans fats in one serving. Choose foods that have low or no amount of these fats.  Check the number of milligrams (mg) of salt (sodium) in one serving. Most people should limit total sodium intake to less than 2,300 mg per day.  Always check the nutrition information of foods labeled as "low-fat" or "  nonfat". These foods may be higher in added sugar or refined carbs and should be avoided.  Talk to your dietitian to identify your daily goals for nutrients listed on the label. Shopping  Avoid buying canned, premade, or processed foods. These foods tend to be high in fat, sodium, and added sugar.  Shop around the outside edge of the grocery store. This includes fresh fruits and vegetables, bulk grains, fresh meats, and fresh dairy. Cooking  Use low-heat cooking methods, such as baking, instead of high-heat cooking methods like deep frying.  Cook using healthy oils, such as olive, canola, or sunflower oil.  Avoid cooking with butter, cream, or high-fat meats. Meal planning  Eat meals and snacks regularly, preferably at the same times every day. Avoid going long periods of time without eating.  Eat foods high in fiber, such as fresh fruits, vegetables, beans, and whole grains. Talk to your dietitian about how many servings of carbs you can eat at each meal.  Eat 4-6 ounces (oz) of lean protein each day, such as lean meat, chicken,  fish, eggs, or tofu. One oz of lean protein is equal to: ? 1 oz of meat, chicken, or fish. ? 1 egg. ?  cup of tofu.  Eat some foods each day that contain healthy fats, such as avocado, nuts, seeds, and fish. Lifestyle  Check your blood glucose regularly.  Exercise regularly as told by your health care provider. This may include: ? 150 minutes of moderate-intensity or vigorous-intensity exercise each week. This could be brisk walking, biking, or water aerobics. ? Stretching and doing strength exercises, such as yoga or weightlifting, at least 2 times a week.  Take medicines as told by your health care provider.  Do not use any products that contain nicotine or tobacco, such as cigarettes and e-cigarettes. If you need help quitting, ask your health care provider.  Work with a Veterinary surgeon or diabetes educator to identify strategies to manage stress and any emotional and social challenges. Questions to ask a health care provider  Do I need to meet with a diabetes educator?  Do I need to meet with a dietitian?  What number can I call if I have questions?  When are the best times to check my blood glucose? Where to find more information:  American Diabetes Association: diabetes.org  Academy of Nutrition and Dietetics: www.eatright.[employer] of Diabetes and Digestive and Kidney Diseases (NIH): CarFlippers.tn Summary  A healthy meal plan will help you control your blood glucose and maintain a healthy lifestyle.  Working with a diet and nutrition specialist (dietitian) can help you make a meal plan that is best for you.  Keep in mind that carbohydrates (carbs) and alcohol have immediate effects on your blood glucose levels. It is important to count carbs and to use alcohol carefully. This information is not intended to replace advice given to you by your health care provider. Make sure you discuss any questions you have with your health care provider. Document  Revised: 11/12/2017 Document Reviewed: 01/04/2017 Elsevier Patient Education  2020 [employer].

## 2020-03-25 NOTE — Assessment & Plan Note (Signed)
Chronic problem.  Pt has been able to control w/ diet and exercise.  UTD on eye exam, foot exam, and on ACE for renal protection.  Check labs.  Adjust tx plan prn

## 2020-03-25 NOTE — Assessment & Plan Note (Signed)
Pt is down 14 lbs since last visit!  She had COVID in January and while she did not get sick, she had a dramatic decrease in appetite.

## 2020-03-25 NOTE — Progress Notes (Signed)
   Subjective:    Patient ID: Priscilla Thompson, female    DOB: 06/19/1951, 69 y.o.   MRN: [id number]  HPI HTN- chronic problem, on Amlodipine 5mg  daily, Lisinopril HCTZ 20/25mg  daily w/ good control.  Denies CP, SOB, HAs, visual changes, edema.  Hyperlipidemia- chronic problem, on Lipitor 20mg  daily and fish oil.  Denies abd pain, N/V  DM- chronic problem.  Attempting to control w/ diet and exercise.  She is down 14 lbs since last visit.  Last A1C 6.6  UTD on eye exam, foot exam, on ACE for renal protection.  No numbness/tingling of hands/feet.  Hypothyroid- chronic problem, on Levothyroxine daily.  Energy level is stable.  No changes to skin/hair/nails.   Review of Systems For ROS see HPI  This visit occurred during the SARS-CoV-2 public health emergency.  Safety protocols were in place, including screening questions prior to the visit, additional usage of staff PPE, and extensive cleaning of exam room while observing appropriate contact time as indicated for disinfecting solutions.       Objective:   Physical Exam Vitals reviewed.  Constitutional:      General: She is not in acute distress.    Appearance: She is well-developed. She is obese.  HENT:     Head: Normocephalic and atraumatic.  Eyes:     Conjunctiva/sclera: Conjunctivae normal.     Pupils: Pupils are equal, round, and reactive to light.  Neck:     Thyroid: No thyromegaly.  Cardiovascular:     Rate and Rhythm: Normal rate and regular rhythm.     Heart sounds: Normal heart sounds. No murmur.  Pulmonary:     Effort: Pulmonary effort is normal. No respiratory distress.     Breath sounds: Normal breath sounds.  Abdominal:     General: There is no distension.     Palpations: Abdomen is soft.     Tenderness: There is no abdominal tenderness.  Musculoskeletal:     Cervical back: Normal range of motion and neck supple.  Lymphadenopathy:     Cervical: No cervical adenopathy.  Skin:    General: Skin is warm and  dry.  Neurological:     Mental Status: She is alert and oriented to person, place, and time.  Psychiatric:        Behavior: Behavior normal.           Assessment & Plan:

## 2020-03-25 NOTE — Assessment & Plan Note (Signed)
Chronic problem.  Tolerating statin w/o difficulty.  Applauded her recent weight loss.  Check labs.  Adjust meds prn  

## 2020-03-25 NOTE — Assessment & Plan Note (Signed)
Chronic problem.  Currently asymptomatic.  Check labs.  Adjust meds prn  

## 2020-03-26 ENCOUNTER — Ambulatory Visit: Payer: Medicare Other

## 2020-03-26 ENCOUNTER — Other Ambulatory Visit: Payer: Self-pay | Admitting: Family Medicine

## 2020-03-26 DIAGNOSIS — E038 Other specified hypothyroidism: Secondary | ICD-10-CM

## 2020-03-26 DIAGNOSIS — E119 Type 2 diabetes mellitus without complications: Secondary | ICD-10-CM

## 2020-03-26 DIAGNOSIS — E785 Hyperlipidemia, unspecified: Secondary | ICD-10-CM

## 2020-03-26 DIAGNOSIS — I10 Essential (primary) hypertension: Secondary | ICD-10-CM

## 2020-03-26 DIAGNOSIS — E1169 Type 2 diabetes mellitus with other specified complication: Secondary | ICD-10-CM

## 2020-04-15 ENCOUNTER — Other Ambulatory Visit: Payer: Self-pay | Admitting: Family Medicine

## 2020-04-17 ENCOUNTER — Other Ambulatory Visit: Payer: Self-pay | Admitting: Family Medicine

## 2020-05-21 ENCOUNTER — Other Ambulatory Visit: Payer: Self-pay | Admitting: Family Medicine

## 2020-06-22 ENCOUNTER — Other Ambulatory Visit: Payer: Self-pay | Admitting: Family Medicine

## 2020-06-24 ENCOUNTER — Ambulatory Visit (INDEPENDENT_AMBULATORY_CARE_PROVIDER_SITE_OTHER): Payer: Medicare Other

## 2020-06-24 ENCOUNTER — Other Ambulatory Visit: Payer: Self-pay

## 2020-06-24 VITALS — BP 136/86 | HR 88 | Temp 98.3°F | Resp 16 | Ht 63.0 in | Wt 199.8 lb

## 2020-06-24 DIAGNOSIS — Z0000 Encounter for general adult medical examination without abnormal findings: Secondary | ICD-10-CM | POA: Diagnosis not present

## 2020-06-24 NOTE — Progress Notes (Signed)
Subjective:   Priscilla Thompson is a 69 y.o. female who presents for an Initial Medicare Annual Wellness Visit.  Review of Systems      Cardiac Risk Factors include: advanced age (>51men, >53 women);diabetes mellitus;dyslipidemia;hypertension;obesity (BMI >30kg/m2);sedentary lifestyle     Objective:    Today's Vitals   06/24/20 0946  BP: 136/86  Pulse: 88  Resp: 16  Temp: 98.3 F (36.8 C)  TempSrc: Oral  SpO2: 99%  Weight: 199 lb 12.8 oz (90.6 kg)  Height: 5\' 3"  (1.6 m)   Body mass index is 35.39 kg/m.  Advanced Directives 06/24/2020  Does Patient Have a Medical Advance Directive? No  Would patient like information on creating a medical advance directive? Yes (MAU/Ambulatory/Procedural Areas - Information given)    Current Medications (verified) Outpatient Encounter Medications as of 06/24/2020  Medication Sig  . acetaminophen (TYLENOL) 650 MG CR tablet Take 1,300 mg by mouth every 8 (eight) hours as needed for pain (for arthritis).  08/25/2020 amLODipine (NORVASC) 5 MG tablet TAKE 1 TABLET BY MOUTH EVERY DAY  . aspirin 81 MG tablet Take 81 mg by mouth daily.  Marland Kitchen atorvastatin (LIPITOR) 20 MG tablet Take 1 tablet (20 mg total) by mouth daily.  . Biotin 2500 MCG CAPS Take 1 capsule by mouth daily.  . Calcium Carb-Cholecalciferol (CALCIUM-VITAMIN D) 500-400 MG-UNIT TABS Take 1 tablet by mouth daily.  . Cholecalciferol (VITAMIN D3) 1000 UNITS CAPS Take 1 capsule by mouth daily.  . cyclobenzaprine (FLEXERIL) 5 MG tablet Take 1 tablet (5 mg total) by mouth 3 (three) times daily as needed for muscle spasms.  . diclofenac (VOLTAREN) 50 MG EC tablet TAKE 1 TABLET BY MOUTH TWICE A DAY  . fish oil-omega-3 fatty acids 1000 MG capsule Take 1,000 mg by mouth daily.   Marland Kitchen KLOR-CON M20 20 MEQ tablet TAKE 1 TABLET BY MOUTH EVERY DAY  . latanoprost (XALATAN) 0.005 % ophthalmic solution Place 1 drop into both eyes at bedtime.   Marland Kitchen levothyroxine (SYNTHROID) 50 MCG tablet TAKE 1 TABLET BY MOUTH EVERY  DAY  . lisinopril-hydrochlorothiazide (ZESTORETIC) 20-25 MG tablet TAKE 1 TABLET BY MOUTH EVERY DAY  . Multiple Vitamins-Minerals (MULTI FOR HER 50+ PO) Take 2 chew by mouth daily  . omeprazole (PRILOSEC) 20 MG capsule Take 20 mg by mouth 3 (three) times a week.   . [DISCONTINUED] levothyroxine (SYNTHROID) 50 MCG tablet TAKE 1 TABLET BY MOUTH EVERY DAY   No facility-administered encounter medications on file as of 06/24/2020.    Allergies (verified) Patient has no known allergies.   History: Past Medical History:  Diagnosis Date  . COVID-19   . Diabetes mellitus   . Hyperlipidemia   . Hypertension   . Hypothyroid   . Osteoarthritis   . Thyroid disease    History reviewed. No pertinent surgical history. Family History  Problem Relation Age of Onset  . Diabetes Sister   . Diabetes Brother   . Cancer Brother        liver, lung   Social History   Socioeconomic History  . Marital status: Single    Spouse name: Not on file  . Number of children: Not on file  . Years of education: Not on file  . Highest education level: Not on file  Occupational History  . Not on file  Tobacco Use  . Smoking status: Never Smoker  . Smokeless tobacco: Never Used  Vaping Use  . Vaping Use: Never used  Substance and Sexual Activity  . Alcohol use:  Yes    Alcohol/week: 0.0 standard drinks    Comment: socially  . Drug use: No  . Sexual activity: Not on file  Other Topics Concern  . Not on file  Social History Narrative  . Not on file   Social Determinants of Health   Financial Resource Strain: Low Risk   . Difficulty of Paying Living Expenses: Not hard at all  Food Insecurity: No Food Insecurity  . Worried About Programme researcher, broadcasting/film/video in the Last Year: Never true  . Ran Out of Food in the Last Year: Never true  Transportation Needs: No Transportation Needs  . Lack of Transportation (Medical): No  . Lack of Transportation (Non-Medical): No  Physical Activity: Inactive  . Days of  Exercise per Week: 0 days  . Minutes of Exercise per Session: 0 min  Stress: No Stress Concern Present  . Feeling of Stress : Not at all  Social Connections: Moderately Integrated  . Frequency of Communication with Friends and Family: More than three times a week  . Frequency of Social Gatherings with Friends and Family: Once a week  . Attends Religious Services: More than 4 times per year  . Active Member of Clubs or Organizations: Yes  . Attends Banker Meetings: More than 4 times per year  . Marital Status: Never married    Tobacco Counseling Counseling given: Not Answered   Clinical Intake:  Pre-visit preparation completed: Yes  Pain : No/denies pain     Nutritional Status: BMI > 30  Obese Nutritional Risks: None Diabetes: Yes CBG done?: No Did pt. bring in CBG monitor from home?: No  How often do you need to have someone help you when you read instructions, pamphlets, or other written materials from your doctor or pharmacy?: 1 - Never What is the last grade level you completed in school?: High school graduate  Nutrition Risk Assessment:  Has the patient had any N/V/D within the last 2 months?  No  Does the patient have any non-healing wounds?  No  Has the patient had any unintentional weight loss or weight gain?  No   Diabetes:  Is the patient diabetic?  Yes  If diabetic, was a CBG obtained today?  No  Did the patient bring in their glucometer from home?  No  How often do you monitor your CBG's? daily.   Financial Strains and Diabetes Management:  Are you having any financial strains with the device, your supplies or your medication? No .  Does the patient want to be seen by Chronic Care Management for management of their diabetes?  No  Would the patient like to be referred to a Nutritionist or for Diabetic Management?  No   Diabetic Exams:  Diabetic Eye Exam: Completed 08/29/2019 .   Diabetic Foot Exam: Completed 07/17/2019.  Interpreter  Needed?: No  Information entered by :: Thomasenia Sales LPN   Activities of Daily Living In your present state of health, do you have any difficulty performing the following activities: 06/24/2020 03/25/2020  Hearing? N N  Vision? N N  Difficulty concentrating or making decisions? N N  Walking or climbing stairs? N N  Dressing or bathing? N N  Doing errands, shopping? N N  Preparing Food and eating ? N -  Using the Toilet? N -  In the past six months, have you accidently leaked urine? N -  Do you have problems with loss of bowel control? N -  Managing your Medications? N -  Managing your Finances? N -  Housekeeping or managing your Housekeeping? N -  Some recent data might be hidden     Immunizations and Health Maintenance Immunization History  Administered Date(s) Administered  . Fluad Quad(high Dose 65+) 08/08/2019  . Influenza Split 12/23/2012  . Influenza,inj,Quad PF,6+ Mos 11/06/2013, 10/08/2014, 09/23/2015, 09/10/2016, 09/17/2017, 07/29/2018  . PFIZER SARS-COV-2 Vaccination 02/26/2020, 03/20/2020  . Pneumococcal Conjugate-13 09/17/2017  . Pneumococcal Polysaccharide-23 10/08/2014  . Tdap 04/14/2013  . Zoster 11/20/2014   Health Maintenance Due  Topic Date Due  . PNA vac Low Risk Adult (2 of 2 - PPSV23) 10/09/2019    Patient Care Team: Sheliah Hatchabori, Katherine E, MD as PCP - General Marcelle OverlieGrewal, Michelle, MD as Consulting Physician (Obstetrics and Gynecology) Ernesto RutherfordGroat, Robert, MD as Consulting Physician (Ophthalmology) Elliot CousinByrnes, Charles, OD (Optometry) Charna ElizabethMann, Jyothi, MD as Consulting Physician (Gastroenterology) Dahlia ByesPotts, Jacob, [facility] HospitalRPH as Pharmacist (Pharmacist)  Indicate any recent Medical Services you may have received from other than [facility] providers in the past year (date may be approximate).     Assessment:   This is a routine wellness examination for Priscilla Thompson.  Hearing/Vision screen  Hearing Screening   125Hz  250Hz  500Hz  1000Hz  2000Hz  3000Hz  4000Hz  6000Hz  8000Hz   Right ear:            Left ear:           Comments: No issues  Vision Screening Comments: Wears glasses Last eye exam 08/2019- Sees Dr. Dione BoozeGroat  Dietary issues and exercise activities discussed: Current Exercise Habits: The patient does not participate in regular exercise at present, Exercise limited by: None identified  Goals Addressed            This Visit's Progress   . Patient Stated       Would like to eat healthier & walk more      Depression Screen PHQ 2/9 Scores 06/24/2020 03/25/2020 09/25/2019 07/17/2019 03/29/2019 09/17/2017 09/17/2017  PHQ - 2 Score 0 0 0 0 0 0 0  PHQ- 9 Score - 0 - 0 - - 0  Exception Documentation - - - - - - -    Fall Risk Fall Risk  06/24/2020 03/25/2020 09/25/2019 07/17/2019 09/17/2017  Falls in the past year? 0 0 0 0 No  Number falls in past yr: 0 0 0 0 -  Injury with Fall? 0 0 0 0 -  Follow up Falls prevention discussed Falls evaluation completed Falls evaluation completed - -    FALL RISK PREVENTION PERTAINING TO THE HOME:  Any stairs in or around the home? Yes  If so, are there any without handrails? No   Home free of loose throw rugs in walkways, pet beds, electrical cords, etc? Yes  Adequate lighting in your home to reduce risk of falls? Yes   ASSISTIVE DEVICES UTILIZED TO PREVENT FALLS:  Life alert? No  Use of a cane, walker or w/c? No  Grab bars in the bathroom? Yes  Shower chair or bench in shower? No  Elevated toilet seat or a handicapped toilet? No    TIMED UP AND GO:  Was the test performed? Yes .  Length of time to ambulate 10 feet: 9 sec.   GAIT:  Appearance of gait: Gait steady and fast without the use of an assistive device.  Education: Fall risk prevention has been discussed.  Intervention(s) required? No   DME/home health order needed?  No    Cognitive Function:     6CIT Screen 06/24/2020  What Year? 0 points  What month?  0 points  What time? 0 points  Count back from 20 0 points  Months in reverse 0 points  Repeat phrase 0  points  Total Score 0    Screening Tests Health Maintenance  Topic Date Due  . PNA vac Low Risk Adult (2 of 2 - PPSV23) 10/09/2019  . Hepatitis C Screening  07/16/2020 (Originally October 07, 1951)  . INFLUENZA VACCINE  07/14/2020  . FOOT EXAM  07/16/2020  . OPHTHALMOLOGY EXAM  08/28/2020  . HEMOGLOBIN A1C  09/24/2020  . MAMMOGRAM  09/27/2020  . DEXA SCAN  03/29/2021  . TETANUS/TDAP  04/15/2023  . COLONOSCOPY  07/07/2023  . COVID-19 Vaccine  Completed    Qualifies for Shingles Vaccine? Yes  Zostavax completed 11/20/2014 Due for Shingrix. Education has been provided regarding the importance of this vaccine. Pt has been advised to call insurance company to determine out of pocket expense. Advised may also receive vaccine at local pharmacy or Health Dept. Verbalized acceptance and understanding.  Tdap: Up to date  Flu Vaccine: Due 08/2020  Pneumococcal Vaccine: Up to date  COVID-19 vaccine: Vaccines received  Cancer Screenings:  Colorectal Screening: Completed 07/06/2018. Repeat every 5 years  Mammogram: Completed 09/28/2019. Repeat every year  Bone Density: Completed 03/30/2019. Results reflect NORMAL Repeat every 2 years.   Lung Cancer Screening: (Low Dose CT Chest recommended if Age 93-80 years, 30 pack-year currently smoking OR have quit w/in 15years.) does not qualify.    Additional Screening:  Hepatitis C Screening: does qualify; Discuss with PCP   Dental Screening: Recommended annual dental exams for proper oral hygiene  Community Resource Referral:  CRR required this visit?  No       Plan:  I have personally reviewed and addressed the Medicare Annual Wellness questionnaire and have noted the following in the patient's chart:  A. Medical and social history B. Use of alcohol, tobacco or illicit drugs  C. Current medications and supplements D. Functional ability and status E.  Nutritional status F.  Physical activity G. Advance directives H. List of other  physicians I.  Hospitalizations, surgeries, and ER visits in previous 12 months J.  Vitals K. Screenings such as hearing and vision if needed, cognitive and depression L. Referrals and appointments   In addition, I have reviewed and discussed with patient certain preventive protocols, quality metrics, and best practice recommendations. A written personalized care plan for preventive services as well as general preventive health recommendations were provided to patient.   Signed,    Roanna Raider, LPN   [id number]  Nurse Health Advisor    Nurse Notes: None

## 2020-06-24 NOTE — Patient Instructions (Addendum)
Ms. Priscilla Thompson , Thank you for taking time to come for your Medicare Wellness Visit. I appreciate your ongoing commitment to your health goals. Please review the following plan we discussed and let me know if I can assist you in the future.   Screening recommendations/referrals: Colonoscopy: Completed 07/06/2018 Due 07/07/2023 Mammogram: Completed 09/28/2019 Due 09/27/2020 Bone Density: Completed 03/30/2019 Due 03/29/2021 Recommended yearly ophthalmology/optometry visit for glaucoma screening and checkup Recommended yearly dental visit for hygiene and checkup  Vaccinations: Influenza vaccine: Due 08/2020 Pneumococcal vaccine: Completed vaccines Tdap vaccine: Up to date-Due 04/15/2023 Shingles vaccine: Discuss with pharmacy   Covid-19:Completed vaccines  Advanced directives: Discussed. Information given.  Conditions/risks identified: See problem list  Next appointment: Follow up in one year for your annual wellness visit 06/30/21 @ 8:15am   Preventive Care 65 Years and Older, Female Preventive care refers to lifestyle choices and visits with your health care provider that can promote health and wellness. What does preventive care include?  A yearly physical exam. This is also called an annual well check.  Dental exams once or twice a year.  Routine eye exams. Ask your health care provider how often you should have your eyes checked.  Personal lifestyle choices, including:  Daily care of your teeth and gums.  Regular physical activity.  Eating a healthy diet.  Avoiding tobacco and drug use.  Limiting alcohol use.  Practicing safe sex.  Taking low-dose aspirin every day.  Taking vitamin and mineral supplements as recommended by your health care provider. What happens during an annual well check? The services and screenings done by your health care provider during your annual well check will depend on your age, overall health, lifestyle risk factors, and family history of  disease. Counseling  Your health care provider may ask you questions about your:  Alcohol use.  Tobacco use.  Drug use.  Emotional well-being.  Home and relationship well-being.  Sexual activity.  Eating habits.  History of falls.  Memory and ability to understand (cognition).  Work and work Astronomer.  Reproductive health. Screening  You may have the following tests or measurements:  Height, weight, and BMI.  Blood pressure.  Lipid and cholesterol levels. These may be checked every 5 years, or more frequently if you are over 98 years old.  Skin check.  Lung cancer screening. You may have this screening every year starting at age 58 if you have a 30-pack-year history of smoking and currently smoke or have quit within the past 15 years.  Fecal occult blood test (FOBT) of the stool. You may have this test every year starting at age 23.  Flexible sigmoidoscopy or colonoscopy. You may have a sigmoidoscopy every 5 years or a colonoscopy every 10 years starting at age 39.  Hepatitis C blood test.  Hepatitis B blood test.  Sexually transmitted disease (STD) testing.  Diabetes screening. This is done by checking your blood sugar (glucose) after you have not eaten for a while (fasting). You may have this done every 1-3 years.  Bone density scan. This is done to screen for osteoporosis. You may have this done starting at age 80.  Mammogram. This may be done every 1-2 years. Talk to your health care provider about how often you should have regular mammograms. Talk with your health care provider about your test results, treatment options, and if necessary, the need for more tests. Vaccines  Your health care provider may recommend certain vaccines, such as:  Influenza vaccine. This is recommended every year.  Tetanus, diphtheria, and acellular pertussis (Tdap, Td) vaccine. You may need a Td booster every 10 years.  Zoster vaccine. You may need this after age  4.  Pneumococcal 13-valent conjugate (PCV13) vaccine. One dose is recommended after age 106.  Pneumococcal polysaccharide (PPSV23) vaccine. One dose is recommended after age 30. Talk to your health care provider about which screenings and vaccines you need and how often you need them. This information is not intended to replace advice given to you by your health care provider. Make sure you discuss any questions you have with your health care provider. Document Released: 12/27/2015 Document Revised: 08/19/2016 Document Reviewed: 10/01/2015 Elsevier Interactive Patient Education  2017 [employer] Prevention in the Home Falls can cause injuries. They can happen to people of all ages. There are many things you can do to make your home safe and to help prevent falls. What can I do on the outside of my home?  Regularly fix the edges of walkways and driveways and fix any cracks.  Remove anything that might make you trip as you walk through a door, such as a raised step or threshold.  Trim any bushes or trees on the path to your home.  Use bright outdoor lighting.  Clear any walking paths of anything that might make someone trip, such as rocks or tools.  Regularly check to see if handrails are loose or broken. Make sure that both sides of any steps have handrails.  Any raised decks and porches should have guardrails on the edges.  Have any leaves, snow, or ice cleared regularly.  Use sand or salt on walking paths during winter.  Clean up any spills in your garage right away. This includes oil or grease spills. What can I do in the bathroom?  Use night lights.  Install grab bars by the toilet and in the tub and shower. Do not use towel bars as grab bars.  Use non-skid mats or decals in the tub or shower.  If you need to sit down in the shower, use a plastic, non-slip stool.  Keep the floor dry. Clean up any water that spills on the floor as soon as it happens.  Remove  soap buildup in the tub or shower regularly.  Attach bath mats securely with double-sided non-slip rug tape.  Do not have throw rugs and other things on the floor that can make you trip. What can I do in the bedroom?  Use night lights.  Make sure that you have a light by your bed that is easy to reach.  Do not use any sheets or blankets that are too big for your bed. They should not hang down onto the floor.  Have a firm chair that has side arms. You can use this for support while you get dressed.  Do not have throw rugs and other things on the floor that can make you trip. What can I do in the kitchen?  Clean up any spills right away.  Avoid walking on wet floors.  Keep items that you use a lot in easy-to-reach places.  If you need to reach something above you, use a strong step stool that has a grab bar.  Keep electrical cords out of the way.  Do not use floor polish or wax that makes floors slippery. If you must use wax, use non-skid floor wax.  Do not have throw rugs and other things on the floor that can make you trip. What can I do  with my stairs?  Do not leave any items on the stairs.  Make sure that there are handrails on both sides of the stairs and use them. Fix handrails that are broken or loose. Make sure that handrails are as long as the stairways.  Check any carpeting to make sure that it is firmly attached to the stairs. Fix any carpet that is loose or worn.  Avoid having throw rugs at the top or bottom of the stairs. If you do have throw rugs, attach them to the floor with carpet tape.  Make sure that you have a light switch at the top of the stairs and the bottom of the stairs. If you do not have them, ask someone to add them for you. What else can I do to help prevent falls?  Wear shoes that:  Do not have high heels.  Have rubber bottoms.  Are comfortable and fit you well.  Are closed at the toe. Do not wear sandals.  If you use a  stepladder:  Make sure that it is fully opened. Do not climb a closed stepladder.  Make sure that both sides of the stepladder are locked into place.  Ask someone to hold it for you, if possible.  Clearly mark and make sure that you can see:  Any grab bars or handrails.  First and last steps.  Where the edge of each step is.  Use tools that help you move around (mobility aids) if they are needed. These include:  Canes.  Walkers.  Scooters.  Crutches.  Turn on the lights when you go into a dark area. Replace any light bulbs as soon as they burn out.  Set up your furniture so you have a clear path. Avoid moving your furniture around.  If any of your floors are uneven, fix them.  If there are any pets around you, be aware of where they are.  Review your medicines with your doctor. Some medicines can make you feel dizzy. This can increase your chance of falling. Ask your doctor what other things that you can do to help prevent falls. This information is not intended to replace advice given to you by your health care provider. Make sure you discuss any questions you have with your health care provider. Document Released: 09/26/2009 Document Revised: 05/07/2016 Document Reviewed: 01/04/2015 Elsevier Interactive Patient Education  2017 [employer].

## 2020-07-01 ENCOUNTER — Other Ambulatory Visit: Payer: Self-pay | Admitting: Family Medicine

## 2020-07-29 ENCOUNTER — Ambulatory Visit: Payer: Medicare Other | Admitting: Family Medicine

## 2020-07-29 ENCOUNTER — Ambulatory Visit (INDEPENDENT_AMBULATORY_CARE_PROVIDER_SITE_OTHER): Payer: Medicare Other | Admitting: Family Medicine

## 2020-07-29 ENCOUNTER — Other Ambulatory Visit: Payer: Self-pay

## 2020-07-29 ENCOUNTER — Encounter: Payer: Self-pay | Admitting: Family Medicine

## 2020-07-29 VITALS — BP 124/81 | HR 88 | Temp 98.2°F | Resp 16 | Ht 63.0 in | Wt 198.5 lb

## 2020-07-29 DIAGNOSIS — E119 Type 2 diabetes mellitus without complications: Secondary | ICD-10-CM

## 2020-07-29 LAB — BASIC METABOLIC PANEL
BUN: 11 mg/dL (ref 6–23)
CO2: 31 mEq/L (ref 19–32)
Calcium: 9.8 mg/dL (ref 8.4–10.5)
Chloride: 101 mEq/L (ref 96–112)
Creatinine, Ser: 0.88 mg/dL (ref 0.40–1.20)
GFR: 76.89 mL/min (ref >60.00)
Glucose, Bld: 102 mg/dL — ABNORMAL HIGH (ref 70–99)
Potassium: 4.5 mEq/L (ref 3.5–5.1)
Sodium: 141 mEq/L (ref 135–145)

## 2020-07-29 LAB — HEMOGLOBIN A1C: Hgb A1c MFr Bld: 6.2 % (ref 4.6–6.5)

## 2020-07-29 NOTE — Patient Instructions (Signed)
Schedule your complete physical in 3-4 months We'll notify you of your lab results and make any changes if needed Continue to work on healthy diet and regular exercise- you can do it! Schedule your eye exam for late September Call with any questions or concerns Stay Safe!  Stay Healthy! HAPPY EARLY BIRTHDAY!!!

## 2020-07-29 NOTE — Assessment & Plan Note (Signed)
Ongoing issue for pt.  Has been able to control w/ diet.  UTD on eye exam.  On ACE for renal protection.  Foot exam done today.  Check labs and determine if adjustments to tx plan are needed.

## 2020-07-29 NOTE — Assessment & Plan Note (Signed)
Ongoing issue for pt.  Again discussed healthy diet and regular exercise.  Will continue to follow.

## 2020-07-29 NOTE — Progress Notes (Signed)
   Subjective:    Patient ID: Priscilla Thompson, female    DOB: 1951/06/17, 69 y.o.   MRN: [id number]  HPI DM- chronic problem, currently diet controlled.  On ACE for renal protection.  UTD on eye exam.  Due for foot exam.  Denies CP, SOB, HAs, visual changes, abd pain, no numbness/tingling of hands/feet.  No sores or blisters.  Morbid obesity- ongoing issue for pt.  BMI is 35.16 w/ HTN, hyperlipidemia, and DM.   Review of Systems For ROS see HPI   This visit occurred during the SARS-CoV-2 public health emergency.  Safety protocols were in place, including screening questions prior to the visit, additional usage of staff PPE, and extensive cleaning of exam room while observing appropriate contact time as indicated for disinfecting solutions.       Objective:   Physical Exam Vitals reviewed.  Constitutional:      General: She is not in acute distress.    Appearance: She is well-developed. She is obese.  HENT:     Head: Normocephalic and atraumatic.  Eyes:     Conjunctiva/sclera: Conjunctivae normal.     Pupils: Pupils are equal, round, and reactive to light.  Neck:     Thyroid: No thyromegaly.  Cardiovascular:     Rate and Rhythm: Normal rate and regular rhythm.     Heart sounds: Normal heart sounds. No murmur heard.   Pulmonary:     Effort: Pulmonary effort is normal. No respiratory distress.     Breath sounds: Normal breath sounds.  Abdominal:     General: There is no distension.     Palpations: Abdomen is soft.     Tenderness: There is no abdominal tenderness.  Musculoskeletal:     Cervical back: Normal range of motion and neck supple.  Lymphadenopathy:     Cervical: No cervical adenopathy.  Skin:    General: Skin is warm and dry.  Neurological:     Mental Status: She is alert and oriented to person, place, and time.  Psychiatric:        Behavior: Behavior normal.           Assessment & Plan:

## 2020-07-30 ENCOUNTER — Encounter: Payer: Self-pay | Admitting: General Practice

## 2020-07-30 ENCOUNTER — Ambulatory Visit: Payer: Medicare Other | Admitting: Family Medicine

## 2020-09-11 ENCOUNTER — Telehealth: Payer: Self-pay

## 2020-09-11 NOTE — Progress Notes (Addendum)
Chronic Care Management Pharmacy Assistant   Name: Priscilla Thompson  MRN: [id number] DOB: [birth year]  Reason for Encounter: Disease state    PCP : Sheliah Hatch, MD  Allergies:  No Known Allergies  Medications: Outpatient Encounter Medications as of 09/11/2020  Medication Sig  . acetaminophen (TYLENOL) 650 MG CR tablet Take 1,300 mg by mouth every 8 (eight) hours as needed for pain (for arthritis).  Marland Kitchen amLODipine (NORVASC) 5 MG tablet TAKE 1 TABLET BY MOUTH EVERY DAY  . aspirin 81 MG tablet Take 81 mg by mouth daily.  Marland Kitchen atorvastatin (LIPITOR) 20 MG tablet TAKE 1 TABLET BY MOUTH EVERY DAY  . Biotin 2500 MCG CAPS Take 1 capsule by mouth daily.  . Calcium Carb-Cholecalciferol (CALCIUM-VITAMIN D) 500-400 MG-UNIT TABS Take 1 tablet by mouth daily.  . Cholecalciferol (VITAMIN D3) 1000 UNITS CAPS Take 1 capsule by mouth daily.  . cyclobenzaprine (FLEXERIL) 5 MG tablet Take 1 tablet (5 mg total) by mouth 3 (three) times daily as needed for muscle spasms.  . diclofenac (VOLTAREN) 50 MG EC tablet TAKE 1 TABLET BY MOUTH TWICE A DAY  . fish oil-omega-3 fatty acids 1000 MG capsule Take 1,000 mg by mouth daily.   Marland Kitchen KLOR-CON M20 20 MEQ tablet TAKE 1 TABLET BY MOUTH EVERY DAY  . latanoprost (XALATAN) 0.005 % ophthalmic solution Place 1 drop into both eyes at bedtime.   Marland Kitchen levothyroxine (SYNTHROID) 50 MCG tablet TAKE 1 TABLET BY MOUTH EVERY DAY  . lisinopril-hydrochlorothiazide (ZESTORETIC) 20-25 MG tablet TAKE 1 TABLET BY MOUTH EVERY DAY  . Multiple Vitamins-Minerals (MULTI FOR HER 50+ PO) Take 2 chew by mouth daily  . omeprazole (PRILOSEC) 20 MG capsule Take 20 mg by mouth 3 (three) times a week.    No facility-administered encounter medications on file as of 09/11/2020.    Current Diagnosis: Patient Active Problem List   Diagnosis Date Noted  . Hyperlipidemia associated with type 2 diabetes mellitus (HCC) 07/17/2019  . Morbid obesity (HCC) 06/23/2018  . Grief 01/12/2013  . Rhinitis  01/27/2012  . General medical examination 08/18/2011  . HAND PAIN, BILATERAL 02/16/2011  . ANKLE EDEMA 02/16/2011  . Diet-controlled diabetes mellitus (HCC) 02/02/2011  . BACK PAIN 02/01/2009  . Hypothyroidism 01/25/2009  . Vitamin D deficiency 01/25/2009  . GLAUCOMA NOS 01/25/2009  . Essential hypertension 01/25/2009    Reviewed chart prior to disease state call. Spoke with patient regarding BP  Recent Office Vitals: BP Readings from Last 3 Encounters:  07/29/20 124/81  06/24/20 136/86  03/25/20 122/81   Pulse Readings from Last 3 Encounters:  07/29/20 88  06/24/20 88  03/25/20 96    Wt Readings from Last 3 Encounters:  07/29/20 198 lb 8 oz (90 kg)  06/24/20 199 lb 12.8 oz (90.6 kg)  03/25/20 196 lb 6 oz (89.1 kg)     Kidney Function Lab Results  Component Value Date/Time   CREATININE 0.88 07/29/2020 09:44 AM   CREATININE 0.80 03/25/2020 09:05 AM   GFR 76.89 07/29/2020 09:44 AM   GFRNONAA 61 02/22/2009 11:32 AM   GFRAA 73 02/22/2009 11:32 AM    BMP Latest Ref Rng & Units 07/29/2020 03/25/2020 09/25/2019  Glucose 70 - 99 mg/dL 417(E) 081(K) 93  BUN 6 - 23 mg/dL 11 13 13   Creatinine 0.40 - 1.20 mg/dL 4.81 8.56  Sodium 135 - 145 mEq/L 141 142 139  Potassium 3.5 - 5.1 mEq/L 4.5 3.9 3.9  Chloride 96 - 112 mEq/L 101 102 101  CO2 19 - 32 mEq/L 31 31 28   Calcium 8.4 - 10.5 mg/dL 9.8 9.7 9.6    . Current antihypertensive regimen:  o Amlodipine 5mg  Tab Take one tab by mouth every day, Atorvastatin 20mg  Tab Take one tab by mouth every day, Lisinopril-Hydrochlorothiazide 20-25mg  tab Take 1 tab by mouth every day  .      How often are you checking your Blood Pressure? 1-2x per week . Current home BP readings: 125s over 80s . What recent interventions/DTPs have been made by any provider to improve Blood Pressure control since last CPP Visit: N/A . Any recent hospitalizations or ED visits since last visit with CPP? No . What diet changes have been made to improve  Blood Pressure Control?  o Patient states she watches her salt intake anh watches her sodium intake. Patient states she trys to watch what she eats . What exercise is being done to improve your Blood Pressure Control?   patient states she walks around the house to get some steps in  Adherence Review: Is the patient currently on ACE/ARB medication? Yes Does the patient have >5 day gap between last estimated fill dates? No   Jomayra L Heredia   Spoke with patient and rescheduled appointment from 09-20-20 to 10-24-20 at 10:30 am.   Marland Kitchen ,[facility] Clinical Pharmacist Assistant [phone]    Follow-Up:  Pharmacist Review

## 2020-09-17 ENCOUNTER — Telehealth: Payer: Medicare Other

## 2020-09-17 DIAGNOSIS — H40023 Open angle with borderline findings, high risk, bilateral: Secondary | ICD-10-CM | POA: Diagnosis not present

## 2020-09-17 DIAGNOSIS — E119 Type 2 diabetes mellitus without complications: Secondary | ICD-10-CM | POA: Diagnosis not present

## 2020-09-17 DIAGNOSIS — H2513 Age-related nuclear cataract, bilateral: Secondary | ICD-10-CM | POA: Diagnosis not present

## 2020-09-17 DIAGNOSIS — H04123 Dry eye syndrome of bilateral lacrimal glands: Secondary | ICD-10-CM | POA: Diagnosis not present

## 2020-09-17 LAB — HM DIABETES EYE EXAM

## 2020-09-20 ENCOUNTER — Telehealth: Payer: Medicare Other

## 2020-09-22 ENCOUNTER — Other Ambulatory Visit: Payer: Self-pay | Admitting: Family Medicine

## 2020-09-30 DIAGNOSIS — H40023 Open angle with borderline findings, high risk, bilateral: Secondary | ICD-10-CM | POA: Diagnosis not present

## 2020-10-02 DIAGNOSIS — Z1231 Encounter for screening mammogram for malignant neoplasm of breast: Secondary | ICD-10-CM | POA: Diagnosis not present

## 2020-10-09 ENCOUNTER — Other Ambulatory Visit: Payer: Self-pay | Admitting: Family Medicine

## 2020-10-13 ENCOUNTER — Other Ambulatory Visit: Payer: Self-pay | Admitting: Family Medicine

## 2020-10-24 ENCOUNTER — Ambulatory Visit: Payer: Medicare Other

## 2020-10-24 DIAGNOSIS — E1169 Type 2 diabetes mellitus with other specified complication: Secondary | ICD-10-CM

## 2020-10-24 DIAGNOSIS — E119 Type 2 diabetes mellitus without complications: Secondary | ICD-10-CM

## 2020-10-24 DIAGNOSIS — E785 Hyperlipidemia, unspecified: Secondary | ICD-10-CM

## 2020-10-24 DIAGNOSIS — I10 Essential (primary) hypertension: Secondary | ICD-10-CM

## 2020-10-24 NOTE — Progress Notes (Signed)
Chronic Care Management Pharmacy  Name: Priscilla Thompson  MRN: [id number] DOB: November 06, 1951  Chief Complaint/ HPI  Priscilla Thompson,  69 y.o. , female presents for their Initial CCM visit with the clinical pharmacist via telephone due to COVID-19 Pandemic.  PCP : Sheliah Hatch, MD  Their chronic conditions include: HTN, HoTR, DM, HLD   Office Visits:  4/12 (PCP): DM f/u. No medication changes.   Outpatient Encounter Medications as of 10/24/2020  Medication Sig  . acetaminophen (TYLENOL) 650 MG CR tablet Take 1,300 mg by mouth every 8 (eight) hours as needed for pain (for arthritis).  Marland Kitchen amLODipine (NORVASC) 5 MG tablet TAKE 1 TABLET BY MOUTH EVERY DAY  . aspirin 81 MG tablet Take 81 mg by mouth daily.  Marland Kitchen atorvastatin (LIPITOR) 20 MG tablet TAKE 1 TABLET BY MOUTH EVERY DAY  . Calcium Carb-Cholecalciferol (CALCIUM-VITAMIN D) 500-400 MG-UNIT TABS Take 1 tablet by mouth daily.  . Cholecalciferol (VITAMIN D3) 1000 UNITS CAPS Take 1 capsule by mouth daily.  . fish oil-omega-3 fatty acids 1000 MG capsule Take 1,000 mg by mouth daily.   Marland Kitchen KLOR-CON M20 20 MEQ tablet TAKE 1 TABLET BY MOUTH EVERY DAY  . latanoprost (XALATAN) 0.005 % ophthalmic solution Place 1 drop into both eyes at bedtime.   Marland Kitchen levothyroxine (SYNTHROID) 50 MCG tablet TAKE 1 TABLET BY MOUTH EVERY DAY  . lisinopril-hydrochlorothiazide (ZESTORETIC) 20-25 MG tablet TAKE 1 TABLET BY MOUTH EVERY DAY  . Multiple Vitamins-Minerals (MULTI FOR HER 50+ PO) Take 2 chew by mouth daily  . omeprazole (PRILOSEC) 20 MG capsule Take 20 mg by mouth 3 (three) times a week.   . Biotin 2500 MCG CAPS Take 1 capsule by mouth daily. (Patient not taking: Reported on 10/24/2020)  . cyclobenzaprine (FLEXERIL) 5 MG tablet Take 1 tablet (5 mg total) by mouth 3 (three) times daily as needed for muscle spasms.  . diclofenac (VOLTAREN) 50 MG EC tablet TAKE 1 TABLET BY MOUTH TWICE A DAY  . dorzolamide-timolol (COSOPT) 22.3-6.8 MG/ML ophthalmic  solution Place 1 drop into both eyes 2 (two) times daily.    No facility-administered encounter medications on file as of 10/24/2020.   Current Diagnosis/Assessment:  Goals Addressed            This Visit's Progress   . PharmD Care Plan   On track    CARE PLAN ENTRY  Current Barriers:  . Chronic Disease Management support, education, and care coordination needs related to: hypertension, hyperlipidemia, diabetes   Pharmacist Clinical Goal(s):  Marland Kitchen Over next six months, maintain: BP <140/90, A1c <7%, TSH 0.35-4.5   Interventions: . Comprehensive medication review performed. . Discussed purposes of medications   Patient Self Care Activities:  . Keep up the good work with diet and exercise!   I enjoyed talking with you - please call with any questions!  Initial goal documentation      Diabetes   Recent Relevant Labs: Lab Results  Component Value Date/Time   HGBA1C 6.2 07/29/2020 09:44 AM   HGBA1C 6.2 03/25/2020 09:05 AM    Checking BG: 110s or under A1c down from October 2020. At goal <6.5%. Patient is currently controlled on diet alone. Checks carbs and sodium on labels.   Limits carbs, oatmeal, eggs,  Last diabetic eye exam:  Lab Results  Component Value Date/Time   HMDIABEYEEXA No Retinopathy 08/29/2019 12:00 AM    Plan Continue current medications and control with diet and exercise and   Hypertension  BP today is:  <  130/80  Office blood pressures are  BP Readings from Last 3 Encounters:  07/29/20 124/81  06/24/20 136/86  03/25/20 122/81   Patient is currently controlled on the following medications: amlodipine 5mg , lisinopril-hctz 20-25mg  daily. Denies dizziness, SOB, chest pain. Patient checks BP at home every 2-4 weeks. Patient home BP readings are ranging: 120s/80s-130s/80s. We discussed dietary recommendations with focus on reducing salt and carbohydrates.  Plan Continue current medications and control with diet and exercise.   Hyperlipidemia    Lipid Panel     Component Value Date/Time   CHOL 140 03/25/2020 0905   TRIG 62.0 03/25/2020 0905   HDL 59.30 03/25/2020 0905   CHOLHDL 2 03/25/2020 0905   VLDL 12.4 03/25/2020 0905   LDLCALC 68 03/25/2020 0905   LDLDIRECT 131.4 08/18/2011 0832    The 10-year ASCVD risk score (Goff DC Jr., et al., 2013) is: 20.3%   Values used to calculate the score:     Age: 61 years     Sex: Female     Is Non-Hispanic African American: Yes     Diabetic: Yes     Tobacco smoker: No     Systolic Blood Pressure: 130 mmHg     Is BP treated: Yes     HDL Cholesterol: 59.3 mg/dL     Total Cholesterol: 140 mg/dL   Patient has failed these meds in past: n/a. Patient is currently controlled on the following medications: atorvastatin 20 mg daily, fish oil 100 mg daily. Denies any muscle or abdominal pain or n/v. Counseled on purpose of medication. Patient expresses understanding. Also taking aspirin 81 mg for CVD prevention. Denies any abnormal bruising, bleeding from nose or gums or blood in urine or stool.  Plan Continue current medications and control with diet and exercise.   Hypothyroidism   TSH  Date Value Ref Range Status  03/25/2020 1.36 0.35 - 4.50 uIU/mL Final   Patient is currently controlled on the following medications: levothyroxine 05/25/2020 daily. We discussed proper administration technique. Denies feeling tired or sluggish. No side effects reported at this time.   Plan Continue current medications.    Vaccines   Immunization History  Administered Date(s) Administered  . Fluad Quad(high Dose 65+) 08/08/2019  . Influenza Split 12/23/2012  . Influenza,inj,Quad PF,6+ Mos 11/06/2013, 10/08/2014, 09/23/2015, 09/10/2016, 09/17/2017, 07/29/2018  . PFIZER SARS-COV-2 Vaccination 02/26/2020, 03/20/2020  . Pneumococcal Conjugate-13 09/17/2017  . Pneumococcal Polysaccharide-23 10/08/2014  . Tdap 04/14/2013  . Zoster 11/20/2014   Reviewed and discussed patient's vaccination history.     Plan  Recommended patient receive flu shot in office, covid booster at local pharmacy.   GERD   Patient denies GERD associated symptoms including dysphagia, heartburn or nausea over the past month. Currently takes omeprazole (Prilosec) 20 mg three times a week.  Expresses understanding to avoid triggers such as tomato sauce.  Plan  Continue current medication.   Visit Information Ms. Bugaj was given information about Chronic Care Management services today including:  1. CCM service includes personalized support from designated clinical staff supervised by her physician, including individualized plan of care and coordination with other care providers 2. 24/7 contact phone numbers for assistance for urgent and routine care needs. 3. Standard insurance, coinsurance, copays and deductibles apply for chronic care management only during months in which we provide at least 20 minutes of these services. Most insurances cover these services at 100%, however patients may be responsible for any copay, coinsurance and/or deductible if applicable. This service may help you avoid the  need for more expensive face-to-face services. 4. Only one practitioner may furnish and bill the service in a calendar month. 5. The patient may stop CCM services at any time (effective at the end of the month) by phone call to the office staff.  Patient agreed to services and verbal consent obtained.  Future Appointments  Date Time Provider Department Center  11/01/2020 10:00 AM Sheliah Hatch, MD [facility]  06/30/2021  8:15 AM [facility]    Dahlia Byes, Pharm.D. Clinical Pharmacist [facility] [phone]

## 2020-11-01 ENCOUNTER — Ambulatory Visit (INDEPENDENT_AMBULATORY_CARE_PROVIDER_SITE_OTHER): Payer: Medicare Other | Admitting: Family Medicine

## 2020-11-01 ENCOUNTER — Other Ambulatory Visit: Payer: Self-pay

## 2020-11-01 ENCOUNTER — Encounter: Payer: Self-pay | Admitting: Family Medicine

## 2020-11-01 VITALS — BP 126/70 | HR 72 | Temp 97.5°F | Resp 16 | Ht 62.0 in | Wt 198.8 lb

## 2020-11-01 DIAGNOSIS — E119 Type 2 diabetes mellitus without complications: Secondary | ICD-10-CM

## 2020-11-01 DIAGNOSIS — E559 Vitamin D deficiency, unspecified: Secondary | ICD-10-CM | POA: Diagnosis not present

## 2020-11-01 DIAGNOSIS — Z0000 Encounter for general adult medical examination without abnormal findings: Secondary | ICD-10-CM | POA: Diagnosis not present

## 2020-11-01 DIAGNOSIS — Z23 Encounter for immunization: Secondary | ICD-10-CM

## 2020-11-01 LAB — CBC WITH DIFFERENTIAL/PLATELET
Basophils Absolute: 0 10*3/uL (ref 0.0–0.1)
Basophils Relative: 0.6 % (ref 0.0–3.0)
Eosinophils Absolute: 0.1 10*3/uL (ref 0.0–0.7)
Eosinophils Relative: 1.5 % (ref 0.0–5.0)
HCT: 40.7 % (ref 36.0–46.0)
Hemoglobin: 13.5 g/dL (ref 12.0–15.0)
Lymphocytes Relative: 42 % (ref 12.0–46.0)
Lymphs Abs: 2.8 10*3/uL (ref 0.7–4.0)
MCHC: 33.3 g/dL (ref 30.0–36.0)
MCV: 85.5 fl (ref 78.0–100.0)
Monocytes Absolute: 0.4 10*3/uL (ref 0.1–1.0)
Monocytes Relative: 6.7 % (ref 3.0–12.0)
Neutro Abs: 3.3 10*3/uL (ref 1.4–7.7)
Neutrophils Relative %: 49.2 % (ref 43.0–77.0)
Platelets: 250 10*3/uL (ref 150.0–400.0)
RBC: 4.75 Mil/uL (ref 3.87–5.11)
RDW: 14.1 % (ref 11.5–15.5)
WBC: 6.6 10*3/uL (ref 4.0–10.5)

## 2020-11-01 LAB — LIPID PANEL
Cholesterol: 138 mg/dL (ref 0–200)
HDL: 62.4 mg/dL (ref >39.00)
LDL Cholesterol: 63 mg/dL (ref 0–99)
NonHDL: 75.42
Total CHOL/HDL Ratio: 2
Triglycerides: 64 mg/dL (ref 0.0–149.0)
VLDL: 12.8 mg/dL (ref 0.0–40.0)

## 2020-11-01 LAB — HEPATIC FUNCTION PANEL
ALT: 17 U/L (ref 0–35)
AST: 14 U/L (ref 0–37)
Albumin: 4.3 g/dL (ref 3.5–5.2)
Alkaline Phosphatase: 61 U/L (ref 39–117)
Bilirubin, Direct: 0.1 mg/dL (ref 0.0–0.3)
Total Bilirubin: 0.7 mg/dL (ref 0.2–1.2)
Total Protein: 7.3 g/dL (ref 6.0–8.3)

## 2020-11-01 LAB — BASIC METABOLIC PANEL
BUN: 11 mg/dL (ref 6–23)
CO2: 31 mEq/L (ref 19–32)
Calcium: 9.6 mg/dL (ref 8.4–10.5)
Chloride: 103 mEq/L (ref 96–112)
Creatinine, Ser: 0.89 mg/dL (ref 0.40–1.20)
GFR: 65.8 mL/min (ref >60.00)
Glucose, Bld: 86 mg/dL (ref 70–99)
Potassium: 3.6 mEq/L (ref 3.5–5.1)
Sodium: 141 mEq/L (ref 135–145)

## 2020-11-01 LAB — HEMOGLOBIN A1C: Hgb A1c MFr Bld: 6.2 % (ref 4.6–6.5)

## 2020-11-01 LAB — VITAMIN D 25 HYDROXY (VIT D DEFICIENCY, FRACTURES): VITD: 57.93 ng/mL (ref 30.00–100.00)

## 2020-11-01 LAB — TSH: TSH: 1.2 u[IU]/mL (ref 0.35–4.50)

## 2020-11-01 NOTE — Progress Notes (Signed)
° °  Subjective:    Patient ID: Priscilla Thompson, female    DOB: 1951/07/22, 69 y.o.   MRN: [id number]  HPI CPE- UTD on mammo, colonoscopy, eye exam, foot exam, DEXA.  UTD on Tdap, pneumonia vaccines, and COVID.  Will get flu today.  Reviewed past medical, surgical, family and social histories.   Health Maintenance  Topic Date Due   INFLUENZA VACCINE  07/14/2020   Hepatitis C Screening  07/29/2021 (Originally 1951-08-23)   PNA vac Low Risk Adult (2 of 2 - PPSV23) 07/29/2021 (Originally 10/09/2019)   HEMOGLOBIN A1C  01/29/2021   DEXA SCAN  03/29/2021   FOOT EXAM  07/29/2021   OPHTHALMOLOGY EXAM  09/17/2021   MAMMOGRAM  10/02/2021   TETANUS/TDAP  04/15/2023   COLONOSCOPY  07/07/2023   COVID-19 Vaccine  Completed    Patient Care Team    Relationship Specialty Notifications Start End  Sheliah Hatch, MD PCP - General   11/26/10   Marcelle Overlie, MD Consulting Physician Obstetrics and Gynecology  06/14/15   Ernesto Rutherford, MD Consulting Physician Ophthalmology  06/14/15   Elliot Cousin, OD  Optometry  06/14/15   Charna Elizabeth, MD Consulting Physician Gastroenterology  06/14/15   Dahlia Byes, [facility] Pharmacist Pharmacist  03/18/20    Comment: phone number [phone]      Review of Systems Patient reports no vision/ hearing changes, adenopathy,fever, weight change,  persistant/recurrent hoarseness , swallowing issues, chest pain, palpitations, edema, persistant/recurrent cough, hemoptysis, dyspnea (rest/exertional/paroxysmal nocturnal), gastrointestinal bleeding (melena, rectal bleeding), abdominal pain, significant heartburn, bowel changes, GU symptoms (dysuria, hematuria, incontinence), Gyn symptoms (abnormal  bleeding, pain),  syncope, focal weakness, memory loss, numbness & tingling, skin/hair/nail changes, abnormal bruising or bleeding, anxiety, or depression.  This visit occurred during the SARS-CoV-2 public health emergency.  Safety protocols were in place, including  screening questions prior to the visit, additional usage of staff PPE, and extensive cleaning of exam room while observing appropriate contact time as indicated for disinfecting solutions.       Objective:   Physical Exam        Assessment & Plan:

## 2020-11-01 NOTE — Patient Instructions (Addendum)
Follow up in 6 months to recheck sugar, BP, cholesterol We'll notify you of your lab results and make any changes if needed Continue to work on healthy diet and regular exercise- you can do it! Call with any questions or concerns Stay Safe!  Stay Healthy! Happy Holidays!

## 2020-11-01 NOTE — Assessment & Plan Note (Signed)
Pt's PE WNL w/ exception of obesity.  UTD on mammo, colonoscopy, pneumonia vaccines, Tdap, COVID.  Will get flu shot today.  Check labs.  Anticipatory guidance provided.

## 2020-11-01 NOTE — Assessment & Plan Note (Signed)
Chronic problem.  On ACE for renal protection.  UTD on eye exam, foot exam.  Stressed need for healthy diet and regular exercise.  Check labs and adjust tx plan prn.

## 2020-12-09 ENCOUNTER — Other Ambulatory Visit: Payer: Self-pay | Admitting: Family Medicine

## 2020-12-10 ENCOUNTER — Encounter: Payer: Self-pay | Admitting: Family Medicine

## 2020-12-10 ENCOUNTER — Other Ambulatory Visit: Payer: Self-pay | Admitting: Emergency Medicine

## 2020-12-10 DIAGNOSIS — E038 Other specified hypothyroidism: Secondary | ICD-10-CM

## 2020-12-10 DIAGNOSIS — E1169 Type 2 diabetes mellitus with other specified complication: Secondary | ICD-10-CM

## 2020-12-10 DIAGNOSIS — I10 Essential (primary) hypertension: Secondary | ICD-10-CM

## 2020-12-10 MED ORDER — POTASSIUM CHLORIDE CRYS ER 20 MEQ PO TBCR: 20.0000 meq | EXTENDED_RELEASE_TABLET | Freq: Every day | ORAL | 1 refills | Status: DC

## 2020-12-10 MED ORDER — ATORVASTATIN CALCIUM 20 MG PO TABS: 20.0000 mg | ORAL_TABLET | Freq: Every day | ORAL | 1 refills | Status: DC

## 2020-12-10 MED ORDER — LEVOTHYROXINE SODIUM 50 MCG PO TABS: 50.0000 ug | ORAL_TABLET | Freq: Every day | ORAL | 1 refills | Status: DC

## 2021-01-09 ENCOUNTER — Telehealth: Payer: Self-pay

## 2021-01-09 NOTE — Progress Notes (Signed)
Chronic Care Management Pharmacy Assistant   Name: REIGHAN HIPOLITO  MRN: [id number] DOB: 01/06/1951  Reason for Encounter: Disease State  PCP : Sheliah Hatch, MD  Allergies:  No Known Allergies  Medications: Outpatient Encounter Medications as of 01/09/2021  Medication Sig  . acetaminophen (TYLENOL) 650 MG CR tablet Take 1,300 mg by mouth every 8 (eight) hours as needed for pain (for arthritis).  Marland Kitchen amLODipine (NORVASC) 5 MG tablet TAKE 1 TABLET BY MOUTH EVERY DAY  . aspirin 81 MG tablet Take 81 mg by mouth daily.  Marland Kitchen atorvastatin (LIPITOR) 20 MG tablet Take 1 tablet (20 mg total) by mouth daily.  . Biotin 2500 MCG CAPS Take 1 capsule by mouth daily. (Patient not taking: Reported on 10/24/2020)  . Calcium Carb-Cholecalciferol (CALCIUM-VITAMIN D) 500-400 MG-UNIT TABS Take 1 tablet by mouth daily.  . Cholecalciferol (VITAMIN D3) 1.25 MG (50000 UT) CAPS Vitamin D3  1000 IU  . Cholecalciferol (VITAMIN D3) 1000 UNITS CAPS Take 1 capsule by mouth daily.  . cyclobenzaprine (FLEXERIL) 5 MG tablet Take 1 tablet (5 mg total) by mouth 3 (three) times daily as needed for muscle spasms.  . diclofenac (VOLTAREN) 50 MG EC tablet TAKE 1 TABLET BY MOUTH TWICE A DAY  . dorzolamide-timolol (COSOPT) 22.3-6.8 MG/ML ophthalmic solution Place 1 drop into both eyes 2 (two) times daily.   . fish oil-omega-3 fatty acids 1000 MG capsule Take 1,000 mg by mouth daily.   Marland Kitchen latanoprost (XALATAN) 0.005 % ophthalmic solution Place 1 drop into both eyes at bedtime.   Marland Kitchen levothyroxine (SYNTHROID) 50 MCG tablet Take 1 tablet (50 mcg total) by mouth daily.  Marland Kitchen lisinopril-hydrochlorothiazide (ZESTORETIC) 20-25 MG tablet TAKE 1 TABLET BY MOUTH EVERY DAY  . Multiple Vitamins-Minerals (MULTI FOR HER 50+ PO) Take 2 chew by mouth daily  . Omega-3 Fatty Acids (FISH OIL) 1000 MG CAPS Fish Oil  . omeprazole (PRILOSEC) 20 MG capsule Take 20 mg by mouth 3 (three) times a week.   . potassium chloride SA (KLOR-CON M20) 20 MEQ  tablet Take 1 tablet (20 mEq total) by mouth daily.   No facility-administered encounter medications on file as of 01/09/2021.    Current Diagnosis: Patient Active Problem List   Diagnosis Date Noted  . Hyperlipidemia associated with type 2 diabetes mellitus (HCC) 07/17/2019  . Morbid obesity (HCC) 06/23/2018  . Grief 01/12/2013  . Rhinitis 01/27/2012  . General medical examination 08/18/2011  . HAND PAIN, BILATERAL 02/16/2011  . ANKLE EDEMA 02/16/2011  . Diet-controlled diabetes mellitus (HCC) 02/02/2011  . BACK PAIN 02/01/2009  . Hypothyroidism 01/25/2009  . Vitamin D deficiency 01/25/2009  . GLAUCOMA NOS 01/25/2009  . Essential hypertension 01/25/2009    Reviewed chart prior to disease state call. Spoke with patient regarding BP  Recent Office Vitals: BP Readings from Last 3 Encounters:  11/01/20 126/70  07/29/20 124/81  06/24/20 136/86   Pulse Readings from Last 3 Encounters:  11/01/20 72  07/29/20 88  06/24/20 88    Wt Readings from Last 3 Encounters:  11/01/20 198 lb 12.8 oz (90.2 kg)  07/29/20 198 lb 8 oz (90 kg)  06/24/20 199 lb 12.8 oz (90.6 kg)     Kidney Function Lab Results  Component Value Date/Time   CREATININE 0.89 11/01/2020 10:51 AM   CREATININE 0.88 07/29/2020 09:44 AM   GFR 65.80 11/01/2020 10:51 AM   GFRNONAA 61 02/22/2009 11:32 AM   GFRAA 73 02/22/2009 11:32 AM    BMP Latest Ref Rng &  Units 11/01/2020 07/29/2020 03/25/2020  Glucose 70 - 99 mg/dL 86 725(D) 664(Q)  BUN 6 - 23 mg/dL 11 11 13   Creatinine 0.40 - 1.20 mg/dL 0.34 7.42  Sodium 135 - 145 mEq/L 141 141 142  Potassium 3.5 - 5.1 mEq/L 3.6 4.5 3.9  Chloride 96 - 112 mEq/L 103 101 102  CO2 19 - 32 mEq/L 31 31 31   Calcium 8.4 - 10.5 mg/dL 9.6 9.8 9.7     . Current antihypertensive regimen:               - amLODipine (NORVASC) 5 MG tablet              - lisinopril-hydrochlorothiazide (ZESTORETIC) 20-25 MG tablet  . How often are you checking your Blood Pressure? weekly    . Current home BP readings: 125/86    . What recent interventions/DTPs have been made by any provider to improve Blood Pressure control since last CPP Visit: None Noted  . Any recent hospitalizations or ED visits since last visit with CPP? No    . What diet changes have been made to improve Blood Pressure Control?  o Patient stated there has been no diet changes . What exercise is being done to improve your Blood Pressure Control?  o Patient stated she does go walking. Patient stated she tries to avoid walking in the cold.  Adherence Review: Is the patient currently on ACE/ARB medication? No Doe s the patient have >5 day gap between last estimated fill dates? CPP to review  Patient has a follow up scheduled in May .   Follow-Up:  Pharmacist Review

## 2021-02-19 ENCOUNTER — Telehealth: Payer: Self-pay

## 2021-02-19 NOTE — Chronic Care Management (AMB) (Addendum)
Chronic Care Management Pharmacy Assistant   Name: Priscilla Thompson  MRN: [id number] DOB: [birth year]  Reason for Encounter: Disease State/Hypertension Adherence Call  No OVs, Consults, or hospital visits since last care coordination call/Pharmacist visit.  Medications: Outpatient Encounter Medications as of 02/19/2021  Medication Sig  . acetaminophen (TYLENOL) 650 MG CR tablet Take 1,300 mg by mouth every 8 (eight) hours as needed for pain (for arthritis).  Marland Kitchen amLODipine (NORVASC) 5 MG tablet TAKE 1 TABLET BY MOUTH EVERY DAY  . aspirin 81 MG tablet Take 81 mg by mouth daily.  Marland Kitchen atorvastatin (LIPITOR) 20 MG tablet Take 1 tablet (20 mg total) by mouth daily.  . Biotin 2500 MCG CAPS Take 1 capsule by mouth daily. (Patient not taking: Reported on 10/24/2020)  . Calcium Carb-Cholecalciferol (CALCIUM-VITAMIN D) 500-400 MG-UNIT TABS Take 1 tablet by mouth daily.  . Cholecalciferol (VITAMIN D3) 1.25 MG (50000 UT) CAPS Vitamin D3  1000 IU  . Cholecalciferol (VITAMIN D3) 1000 UNITS CAPS Take 1 capsule by mouth daily.  . cyclobenzaprine (FLEXERIL) 5 MG tablet Take 1 tablet (5 mg total) by mouth 3 (three) times daily as needed for muscle spasms.  . diclofenac (VOLTAREN) 50 MG EC tablet TAKE 1 TABLET BY MOUTH TWICE A DAY  . dorzolamide-timolol (COSOPT) 22.3-6.8 MG/ML ophthalmic solution Place 1 drop into both eyes 2 (two) times daily.   . fish oil-omega-3 fatty acids 1000 MG capsule Take 1,000 mg by mouth daily.   Marland Kitchen latanoprost (XALATAN) 0.005 % ophthalmic solution Place 1 drop into both eyes at bedtime.   Marland Kitchen levothyroxine (SYNTHROID) 50 MCG tablet Take 1 tablet (50 mcg total) by mouth daily.  Marland Kitchen lisinopril-hydrochlorothiazide (ZESTORETIC) 20-25 MG tablet TAKE 1 TABLET BY MOUTH EVERY DAY  . Multiple Vitamins-Minerals (MULTI FOR HER 50+ PO) Take 2 chew by mouth daily  . Omega-3 Fatty Acids (FISH OIL) 1000 MG CAPS Fish Oil  . omeprazole (PRILOSEC) 20 MG capsule Take 20 mg by mouth 3 (three) times a  week.   . potassium chloride SA (KLOR-CON M20) 20 MEQ tablet Take 1 tablet (20 mEq total) by mouth daily.   No facility-administered encounter medications on file as of 02/19/2021.   Reviewed chart for medication changes ahead of disease state call.  No OVs, Consults, or hospital visits since last care coordination call/Pharmacist visit.  No medication changes indicated.  Recent Office Vitals: BP Readings from Last 3 Encounters:  11/01/20 126/70  07/29/20 124/81  06/24/20 136/86   Pulse Readings from Last 3 Encounters:  11/01/20 72  07/29/20 88  06/24/20 88    Wt Readings from Last 3 Encounters:  11/01/20 198 lb 12.8 oz (90.2 kg)  07/29/20 198 lb 8 oz (90 kg)  06/24/20 199 lb 12.8 oz (90.6 kg)     Kidney Function Lab Results  Component Value Date/Time   CREATININE 0.89 11/01/2020 10:51 AM   CREATININE 0.88 07/29/2020 09:44 AM   GFR 65.80 11/01/2020 10:51 AM   GFRNONAA 61 02/22/2009 11:32 AM   GFRAA 73 02/22/2009 11:32 AM    BMP Latest Ref Rng & Units 11/01/2020 07/29/2020 03/25/2020  Glucose 70 - 99 mg/dL 86 628(Z) 662(H)  BUN 6 - 23 mg/dL 11 11 13   Creatinine 0.40 - 1.20 mg/dL 4.76 5.46  Sodium 135 - 145 mEq/L 141 141 142  Potassium 3.5 - 5.1 mEq/L 3.6 4.5 3.9  Chloride 96 - 112 mEq/L 103 101 102  CO2 19 - 32 mEq/L 31 31 31   Calcium 8.4 -  10.5 mg/dL 9.6 9.8 9.7    . Current antihypertensive regimen:  o Amlodipine 5 mg tablet daily o Lisinopril-HCTZ 20-25 mg tablet daily  . How often are you checking your Blood Pressure? 1-2x per week   . Current home BP readings: 127/80, 130/85  . What recent interventions/DTPs have been made by any provider to improve Blood Pressure control since last CPP Visit: None note. Patient states she is currently taking her medications as instructed.  . Any recent hospitalizations or ED visits since last visit with CPP? No , patient has not had any recent hospitalizations or ED visits since her last visit with Dahlia Byes,  CPP.  Marland Kitchen What diet changes have been made to improve Blood Pressure Control?  o Patient states her brother is a diabetic and she cooks meals for him so for dinner she will cook vegetables and baked foods. She will cereal or oatmeal for breakfast with [place] bacon or [place] sausage or a chicken patty.  . What exercise is being done to improve your Blood Pressure Control?  o Patient states she doesn't exercise regularly. However, she states she walks around the house and stays active at least 30 minutes a day.  Adherence Review: Is the patient currently on ACE/ARB medication? Yes Does the patient have >5 day gap between last estimated fill dates? Yes  Star Rating Drugs: Atorvastatin 20 mg last filled 12/20/2020 90 DS Lisinopril-HCTZ 20-25 mg last filled 10/23/2020 90 DS - correction - is 01/20/2021 for last filled date.   -Patient states she has not ran out of her Lisinopril or Amlodipine. Patient states she had some extra on hand.  Future Appointments  Date Time Provider Department Center  04/18/2021 10:00 AM [facility] CCM PHARMACIST [facility]  05/02/2021 10:30 AM Sheliah Hatch, MD [facility]  06/30/2021  8:15 AM [facility] HEALTH COACH [facility]    April D Calhoun, [facility] Clinical Pharmacist Assistant [phone]

## 2021-04-17 ENCOUNTER — Other Ambulatory Visit: Payer: Self-pay | Admitting: Family Medicine

## 2021-04-18 ENCOUNTER — Ambulatory Visit (INDEPENDENT_AMBULATORY_CARE_PROVIDER_SITE_OTHER): Payer: Medicare Other

## 2021-04-18 ENCOUNTER — Other Ambulatory Visit: Payer: Self-pay

## 2021-04-18 DIAGNOSIS — I10 Essential (primary) hypertension: Secondary | ICD-10-CM

## 2021-04-18 DIAGNOSIS — E119 Type 2 diabetes mellitus without complications: Secondary | ICD-10-CM

## 2021-04-18 DIAGNOSIS — E785 Hyperlipidemia, unspecified: Secondary | ICD-10-CM | POA: Diagnosis not present

## 2021-04-18 DIAGNOSIS — E1169 Type 2 diabetes mellitus with other specified complication: Secondary | ICD-10-CM | POA: Diagnosis not present

## 2021-04-18 NOTE — Patient Instructions (Signed)
Priscilla Thompson,  Thank you for talking with me today. I have included our care plan/goals in the following pages.   Please review and call me at [phone] with any questions.  Thanks! Priscilla Thompson, PharmD, CPP Clinical Pharmacist Practitioner  [facility]  [phone]  Patient Care Plan: CCM Pharmacy Care Plam    Problem Identified: Diet controlled DM, hypothyroidism, HLD, ankle edema   Priority: High    Long-Range Goal: Disease Management   Start Date: 04/18/2021  Expected End Date: 04/18/2022  This Visit's Progress: On track  Priority: High  Note:   Current Barriers:  . Working towards exercise goal of 150 minutes per week  Pharmacist Clinical Goal(s):  Marland Kitchen Patient will contact provider office for questions/concerns as evidenced notation of same in electronic health record through collaboration with PharmD and provider.   Interventions: . 1:1 collaboration with Sheliah Hatch, MD regarding development and update of comprehensive plan of care as evidenced by provider attestation and co-signature . Inter-disciplinary care team collaboration (see longitudinal plan of care) . Comprehensive medication review performed; medication list updated in electronic medical record . No medication changes  Hypertension (BP goal <130/80) Has felt great recently, no complaints. Checking BP 1x/week at home. Does not ever feel dizzy.  -Controlled -Current treatment: . Amlodipine 5 mg once daily . Lisinopril-HCTZ 20-25 mg once daily  -Current home readings: consistently at goal. No fill gaps.  -Denies hypotensive/hypertensive symptoms. -No side effects, tolerating well. No fill gaps.  -Educated on BP goals and benefits of medications for prevention of heart attack, stroke and kidney damage; -Counseled to monitor BP at home 1x/wk as already doing, document, and provide log at future appointments -Recommended to continue current medication  Hyperlipidemia: (LDL goal <  70) -Controlled -Current treatment: . Atorvastatin 20 mg once daily before bed  -No side effects, tolerating well. No fill gaps.  -Educated on Cholesterol goals;  -Recommended to continue current medication  Diabetes (A1c goal <6.5%) -Controlled -Current medications: . N/a - diet only  --Current meal patterns: vegetables, bakes meat, salad, fruit. Trying to drink a lot of water. Exercise: some walking outdoors and indoors, no formal exercise routine.  -Educated on A1c and blood sugar goals; -Counseled to check feet daily and get yearly eye exams -Recommended to continue current medication  Bone health (Goal: prevent fractures) -Controlled -Last DEXA Scan: 03/2021 - pt reported 'normal' through Dr Vincente Poli -Patient is not a candidate for pharmacologic treatment -Current treatment  . Calcium and vitamin d3 supplementation -Recommend weight-bearing and muscle strengthening exercises for building and maintaining bone density. -Recommended to continue current medication  Patient Goals/Self-Care Activities . Patient will:  - take medications as prescribed target a minimum of 150 minutes of moderate intensity exercise weekly  Medication Assistance: None required.  Patient affirms current coverage meets needs. [phone] Ginette Otto, [place] - [address] [place] [address] Phone: [phone] Fax: [phone]  Follow Up:  Patient agrees to Care Plan and Follow-up. Plan: RPH f/u calll 6-8 months. CPA call 3-4 months.      The patient verbalized understanding of instructions provided today and agreed to receive a MyChart copy of patient instruction and/or educational materials. Telephone follow up appointment with pharmacy team member scheduled for: See next appointment with "Care Management Staff" under "What's Next" below.

## 2021-04-18 NOTE — Progress Notes (Signed)
Chronic Care Management Pharmacy Note  04/18/2021 Name:  Priscilla Thompson MRN:  [id number] DOB:  1951/11/10  Subjective: Priscilla Thompson is an 70 y.o. year old female who is a primary patient of Tabori, Aundra Millet, MD.  The CCM team was consulted for assistance with disease management and care coordination needs.    Engaged with patient by telephone for follow up visit in response to provider referral for pharmacy case management and/or care coordination services.   Consent to Services:  The patient was given information about Chronic Care Management services, agreed to services, and gave verbal consent prior to initiation of services.  Please see initial visit note for detailed documentation.   Patient Care Team: Midge Minium, MD as PCP - Ventura Bruns, MD as Consulting Physician (Obstetrics and Gynecology) Clent Jacks, MD as Consulting Physician (Ophthalmology) Dyke Maes, OD (Optometry) Juanita Craver, MD as Consulting Physician (Gastroenterology) Madelin Rear, [facility] as Pharmacist (Pharmacist)  Hospital visits: None in previous 6 months  Objective:  Lab Results  Component Value Date   CREATININE 0.89 11/01/2020   CREATININE 0.88 07/29/2020   CREATININE 0.80 03/25/2020    Lab Results  Component Value Date   HGBA1C 6.2 11/01/2020   Last diabetic Eye exam:  Lab Results  Component Value Date/Time   HMDIABEYEEXA No Retinopathy 09/17/2020 01:50 PM    Last diabetic Foot exam: No results found for: HMDIABFOOTEX      Component Value Date/Time   CHOL 138 11/01/2020 1051   TRIG 64.0 11/01/2020 1051   HDL 62.40 11/01/2020 1051   CHOLHDL 2 11/01/2020 1051   VLDL 12.8 11/01/2020 1051   LDLCALC 63 11/01/2020 1051   LDLDIRECT 131.4 08/18/2011 0832   Hepatic Function Latest Ref Rng & Units 11/01/2020 03/25/2020 07/17/2019  Total Protein 6.0 - 8.3 g/dL 7.3 7.2 7.2  Albumin 3.5 - 5.2 g/dL 4.3 4.4 4.4  AST 0 - 37 U/L _0 ALT 0 - 35 U/L _1 Alk Phosphatase 39 - 117 U/L 61 63 61  Total Bilirubin 0.2 - 1.2 mg/dL 0.7 0.6 0.6  Bilirubin, Direct 0.0 - 0.3 mg/dL 0.1 0.1 0.1    Lab Results  Component Value Date/Time   TSH 1.20 11/01/2020 10:51 AM   TSH 1.36 03/25/2020 09:05 AM   FREET4 1.05 01/13/2011 12:00 AM    CBC Latest Ref Rng & Units 11/01/2020 03/25/2020 07/17/2019  WBC 4.0 - 10.5 K/uL 6.6 6.7 6.8  Hemoglobin 12.0 - 15.0 g/dL 13.5 12.9 12.9  Hematocrit 36.0 - 46.0 % 40.7 39.1 39.1  Platelets 150.0 - 400.0 K/uL 250.0 238.0 270.0    Lab Results  Component Value Date/Time   VD25OH 57.93 11/01/2020 10:51 AM   VD25OH 61.61 07/17/2019 11:44 AM    Clinical ASCVD:  The 10-year ASCVD risk score Mikey Bussing DC Jr., et al., 2013) is: 19.1%   Values used to calculate the score:     Age: 63 years     Sex: Female     Is Non-Hispanic African American: Yes     Diabetic: Yes     Tobacco smoker: No     Systolic Blood Pressure: 407 mmHg     Is BP treated: Yes     HDL Cholesterol: 62.4 mg/dL     Total Cholesterol: 138 mg/dL    Social History   Tobacco Use  Smoking Status Never Smoker  Smokeless Tobacco Never Used   BP Readings from Last 3 Encounters:  11/01/20 126/70  07/29/20 124/81  06/24/20 136/86   Pulse Readings from Last 3 Encounters:  11/01/20 72  07/29/20 88  06/24/20 88   Wt Readings from Last 3 Encounters:  11/01/20 198 lb 12.8 oz (90.2 kg)  07/29/20 198 lb 8 oz (90 kg)  06/24/20 199 lb 12.8 oz (90.6 kg)   Assessment: Review of patient past medical history, allergies, medications, health status, including review of consultants reports, laboratory and other test data, was performed as part of comprehensive evaluation and provision of chronic care management services.   SDOH:  (Social Determinants of Health) assessments and interventions performed: Yes.  CCM Care Plan  No Known Allergies  Medications Reviewed Today    Reviewed by Madelin Rear, [facility] (Pharmacist) on 04/18/21 at 1024  Med List Status: <None>   Medication Order Taking? Sig Documenting Provider Last Dose Status Informant  acetaminophen (TYLENOL) 650 MG CR tablet [id number]  Take 1,300 mg by mouth every 8 (eight) hours as needed for pain (for arthritis). [provider]  Active   amLODipine (NORVASC) 5 MG tablet [id number] Yes TAKE 1 TABLET BY MOUTH EVERY DAY Midge Minium, MD Taking Active   aspirin 81 MG tablet [id number] Yes Take 81 mg by mouth daily. [provider] Taking Active   atorvastatin (LIPITOR) 20 MG tablet [id number] Yes Take 1 tablet (20 mg total) by mouth daily. Midge Minium, MD Taking Active   Calcium Carb-Cholecalciferol (CALCIUM-VITAMIN D) 500-400 MG-UNIT TABS [id number] Yes Take 1 tablet by mouth daily. [provider] Taking Active   Cholecalciferol (VITAMIN D3) 1000 UNITS CAPS [id number] Yes Take 1 capsule by mouth daily. [provider] Taking Active   cyclobenzaprine (FLEXERIL) 5 MG tablet [id number]  Take 1 tablet (5 mg total) by mouth 3 (three) times daily as needed for muscle spasms. Midge Minium, MD  Active   diclofenac (VOLTAREN) 50 MG EC tablet [id number]  TAKE 1 TABLET BY MOUTH TWICE A DAY Midge Minium, MD  Active   dorzolamide-timolol (COSOPT) 22.3-6.8 MG/ML ophthalmic solution [id number]  Place 1 drop into both eyes 2 (two) times daily.  [provider]  Active   fish oil-omega-3 fatty acids 1000 MG capsule [id number] Yes Take 1,000 mg by mouth daily.  [provider] Taking Active   latanoprost (XALATAN) 0.005 % ophthalmic solution [id number]  Place 1 drop into both eyes at bedtime.  [provider]  Active   levothyroxine (SYNTHROID) 50 MCG tablet [id number] Yes Take 1 tablet (50 mcg total) by mouth daily. Midge Minium, MD Taking Active   lisinopril-hydrochlorothiazide (ZESTORETIC) 20-25 MG tablet [id number] Yes TAKE 1 TABLET BY MOUTH EVERY DAY Midge Minium, MD Taking Active   Multiple Vitamins-Minerals (MULTI FOR HER 50+  PO) [id number] Yes Take 2 chew by mouth daily [provider] Taking Active   omeprazole (PRILOSEC) 20 MG capsule [id number]  Take 20 mg by mouth 3 (three) times a week.  [provider]  Active   potassium chloride SA (KLOR-CON M20) 20 MEQ tablet [id number] Yes Take 1 tablet (20 mEq total) by mouth daily. Midge Minium, MD Taking Active           Patient Active Problem List   Diagnosis Date Noted  . Hyperlipidemia associated with type 2 diabetes mellitus ([place]) 07/17/2019  . Morbid obesity ([place]) 06/23/2018  . Grief 01/12/2013  . Rhinitis 01/27/2012  . General medical examination 08/18/2011  . HAND PAIN, BILATERAL 02/16/2011  . ANKLE EDEMA 02/16/2011  . Diet-controlled diabetes mellitus ([place])  02/02/2011  . BACK PAIN 02/01/2009  . Hypothyroidism 01/25/2009  . Vitamin D deficiency 01/25/2009  . GLAUCOMA NOS 01/25/2009  . Essential hypertension 01/25/2009    Immunization History  Administered Date(s) Administered  . Fluad Quad(high Dose 65+) 08/08/2019, 11/01/2020  . Influenza Split 12/23/2012  . Influenza,inj,Quad PF,6+ Mos 11/06/2013, 10/08/2014, 09/23/2015, 09/10/2016, 09/17/2017, 07/29/2018  . PFIZER SARS-COV-2 Pediatric Vaccination 5-67yr 10/25/2020  . PFIZER(Purple Top)SARS-COV-2 Vaccination 02/26/2020, 03/20/2020, 10/25/2020  . Pneumococcal Conjugate-13 09/17/2017  . Pneumococcal Polysaccharide-23 10/08/2014  . Tdap 04/14/2013  . Zoster 11/20/2014   Conditions to be addressed/monitored: Diet controlled DM, hypothyroidism, HLD, ankle edema    Care [place] : [place]  Updates made by [facility]adelin Rear [facility] 04/18/2021 12:00 AM    Problem: Diet controlled DM, hypothyroidism, HLD, ankle edema   Priority: High    Long-Range Goal: Disease Management   Start Date: 04/18/2021  Expected End Date: 04/18/2022  This Visit's Progress: On track  Priority: High  Note:   Current Barriers:  . Working towards exercise goal of 150 minutes per  week  Pharmacist Clinical Goal(s):  .Marland KitchenPatient will contact provider office for questions/concerns as evidenced notation of same in electronic health record through collaboration with PharmD and provider.   Interventions: . 1:1 collaboration with TMidge Minium MD regarding development and update of comprehensive plan of care as evidenced by provider attestation and co-signature . Inter-disciplinary care team collaboration (see longitudinal plan of care) . Comprehensive medication review performed; medication list updated in electronic medical record . No medication changes  Hypertension (BP goal <130/80) -Controlled -Current treatment: . Amlodipine 5 mg once daily . Lisinopril-HCTZ 20-25 mg once daily  -Current home readings: consistently at goal. No fill gaps.  -Denies hypotensive/hypertensive symptoms. -No side effects, tolerating well. No fill gaps.  -Educated on BP goals and benefits of medications for prevention of heart attack, stroke and kidney damage; -Counseled to monitor BP at home 1x/wk as already doing, document, and provide log at future appointments -Recommended to continue current medication  Hyperlipidemia: (LDL goal < 70) -Controlled -Current treatment: . Atorvastatin 20 mg once daily before bed  -No side effects, tolerating well. No fill gaps.  -Educated on Cholesterol goals;  -Recommended to continue current medication  Diabetes (A1c goal <6.5%) -Controlled -Current medications: . N/a - diet only  --Current meal patterns: vegetables, bakes meat, salad, fruit. Trying to drink a lot of water. Exercise: some walking outdoors and indoors, no formal exercise routine.  -Educated on A1c and blood sugar goals; -Counseled to check feet daily and get yearly eye exams -Recommended to continue current medication  Bone health (Goal: prevent fractures) -Controlled -Last DEXA Scan: 03/2021 - pt reported 'normal' through Dr GHelane Rima-Patient is not a candidate for  pharmacologic treatment -Current treatment  . Calcium and vitamin d3 supplementation -Recommend weight-bearing and muscle strengthening exercises for building and maintaining bone density. -Recommended to continue current medication  Patient Goals/Self-Care Activities . Patient will:  - take medications as prescribed target a minimum of 150 minutes of moderate intensity exercise weekly  Medication Assistance: None required.  Patient affirms current coverage meets needs. [id number] [place]: [phone]: [phone] Up:  Patient agrees to Care Plan and Follow-up. Plan: RPH f/u calll 6-8 months. CPA call 3-4 months.      Future Appointments  Date Time Provider [place]5/20/2022 10:30 AM  Midge Minium, MD [facility]  06/30/2021  8:15 AM [facility] HEALTH COACH [facility]  10/22/2021 10:30 AM [facility] CCM PHARMACIST [facility]   Madelin Rear, PharmD, CPP Clinical Pharmacist Practitioner  [facility]  [phone]

## 2021-05-02 ENCOUNTER — Ambulatory Visit (INDEPENDENT_AMBULATORY_CARE_PROVIDER_SITE_OTHER): Payer: Medicare Other | Admitting: Family Medicine

## 2021-05-02 ENCOUNTER — Other Ambulatory Visit: Payer: Self-pay

## 2021-05-02 ENCOUNTER — Encounter: Payer: Self-pay | Admitting: Family Medicine

## 2021-05-02 VITALS — BP 122/80 | HR 82 | Temp 98.3°F | Resp 19 | Ht 62.0 in | Wt 201.6 lb

## 2021-05-02 DIAGNOSIS — E1169 Type 2 diabetes mellitus with other specified complication: Secondary | ICD-10-CM | POA: Diagnosis not present

## 2021-05-02 DIAGNOSIS — I10 Essential (primary) hypertension: Secondary | ICD-10-CM | POA: Diagnosis not present

## 2021-05-02 DIAGNOSIS — E119 Type 2 diabetes mellitus without complications: Secondary | ICD-10-CM

## 2021-05-02 DIAGNOSIS — E785 Hyperlipidemia, unspecified: Secondary | ICD-10-CM

## 2021-05-02 LAB — BASIC METABOLIC PANEL
BUN: 13 mg/dL (ref 6–23)
CO2: 26 mEq/L (ref 19–32)
Calcium: 9.5 mg/dL (ref 8.4–10.5)
Chloride: 105 mEq/L (ref 96–112)
Creatinine, Ser: 0.82 mg/dL (ref 0.40–1.20)
GFR: 72.35 mL/min (ref >60.00)
Glucose, Bld: 88 mg/dL (ref 70–99)
Potassium: 3.5 mEq/L (ref 3.5–5.1)
Sodium: 141 mEq/L (ref 135–145)

## 2021-05-02 LAB — CBC WITH DIFFERENTIAL/PLATELET
Basophils Absolute: 0 10*3/uL (ref 0.0–0.1)
Basophils Relative: 0.5 % (ref 0.0–3.0)
Eosinophils Absolute: 0.1 10*3/uL (ref 0.0–0.7)
Eosinophils Relative: 1.4 % (ref 0.0–5.0)
HCT: 39.9 % (ref 36.0–46.0)
Hemoglobin: 13.2 g/dL (ref 12.0–15.0)
Lymphocytes Relative: 35.1 % (ref 12.0–46.0)
Lymphs Abs: 2.3 10*3/uL (ref 0.7–4.0)
MCHC: 33.2 g/dL (ref 30.0–36.0)
MCV: 86.2 fl (ref 78.0–100.0)
Monocytes Absolute: 0.4 10*3/uL (ref 0.1–1.0)
Monocytes Relative: 6.8 % (ref 3.0–12.0)
Neutro Abs: 3.7 10*3/uL (ref 1.4–7.7)
Neutrophils Relative %: 56.2 % (ref 43.0–77.0)
Platelets: 224 10*3/uL (ref 150.0–400.0)
RBC: 4.63 Mil/uL (ref 3.87–5.11)
RDW: 14.2 % (ref 11.5–15.5)
WBC: 6.6 10*3/uL (ref 4.0–10.5)

## 2021-05-02 LAB — HEPATIC FUNCTION PANEL
ALT: 18 U/L (ref 0–35)
AST: 15 U/L (ref 0–37)
Albumin: 4.3 g/dL (ref 3.5–5.2)
Alkaline Phosphatase: 55 U/L (ref 39–117)
Bilirubin, Direct: 0.1 mg/dL (ref 0.0–0.3)
Total Bilirubin: 0.6 mg/dL (ref 0.2–1.2)
Total Protein: 7.1 g/dL (ref 6.0–8.3)

## 2021-05-02 LAB — LIPID PANEL
Cholesterol: 141 mg/dL (ref 0–200)
HDL: 65.7 mg/dL (ref >39.00)
LDL Cholesterol: 64 mg/dL (ref 0–99)
NonHDL: 75.58
Total CHOL/HDL Ratio: 2
Triglycerides: 59 mg/dL (ref 0.0–149.0)
VLDL: 11.8 mg/dL (ref 0.0–40.0)

## 2021-05-02 LAB — HEMOGLOBIN A1C: Hgb A1c MFr Bld: 6.3 % (ref 4.6–6.5)

## 2021-05-02 LAB — TSH: TSH: 1.14 u[IU]/mL (ref 0.35–4.50)

## 2021-05-02 NOTE — Patient Instructions (Signed)
Schedule your complete physical in 6 months We'll notify you of your lab results and make any changes if needed Continue to work on healthy diet and regular exercise- you can do it! Call with any questions or concerns Stay Safe!  Stay Healthy! Have a great summer!!! 

## 2021-05-02 NOTE — Assessment & Plan Note (Signed)
Ongoing issue for pt.  Her BMI of 36.87 when coupled w/ her other co-morbidities qualifies as morbidly obese.  Discussed need for healthy diet and regular exercise.  Will continue to follow.

## 2021-05-02 NOTE — Progress Notes (Signed)
   Subjective:    Patient ID: Priscilla Thompson, female    DOB: 05-19-1951, 70 y.o.   MRN: [id number]  HPI HTN- chronic problem, on Amlodipine 5mg  daily, Lisinopril HCTZ 20/25mg  daily w/ good control.  Denies CP, SOB, HAs, visual changes, edema.  Hyperlipidemia- chronic problem, on Lipitor 20mg  daily.  No abd pain, N/V.  DM- chronic problem, has been diet controlled.  UTD on eye exam, foot exam.  On ACE for renal protection.  Denies numbness/tingling of hands/feet.  Obesity- BMI 36.87.  Attempting to follow low card diet   Review of Systems For ROS see HPI   This visit occurred during the SARS-CoV-2 public health emergency.  Safety protocols were in place, including screening questions prior to the visit, additional usage of staff PPE, and extensive cleaning of exam room while observing appropriate contact time as indicated for disinfecting solutions.       Objective:   Physical Exam Vitals reviewed.  Constitutional:      General: She is not in acute distress.    Appearance: Normal appearance. She is well-developed. She is obese. She is not ill-appearing.  HENT:     Head: Normocephalic and atraumatic.  Eyes:     Conjunctiva/sclera: Conjunctivae normal.     Pupils: Pupils are equal, round, and reactive to light.  Neck:     Thyroid: No thyromegaly.  Cardiovascular:     Rate and Rhythm: Normal rate and regular rhythm.     Pulses: Normal pulses.     Heart sounds: Normal heart sounds. No murmur heard.   Pulmonary:     Effort: Pulmonary effort is normal. No respiratory distress.     Breath sounds: Normal breath sounds.  Abdominal:     General: There is no distension.     Palpations: Abdomen is soft.     Tenderness: There is no abdominal tenderness.  Musculoskeletal:     Cervical back: Normal range of motion and neck supple.     Right lower leg: No edema.     Left lower leg: No edema.  Lymphadenopathy:     Cervical: No cervical adenopathy.  Skin:    General: Skin is warm  and dry.  Neurological:     Mental Status: She is alert and oriented to person, place, and time.  Psychiatric:        Behavior: Behavior normal.           Assessment & Plan:

## 2021-05-02 NOTE — Assessment & Plan Note (Signed)
Ongoing issue.  Has been able to control w/ diet.  UTD on eye exam, foot exam.  On ACE for renal protection.  Check labs and start meds if needed.

## 2021-05-02 NOTE — Assessment & Plan Note (Signed)
Chronic problem, on Lipitor 20mg daily w/o difficulty.  Check labs.  Adjust meds prn  

## 2021-05-02 NOTE — Assessment & Plan Note (Signed)
Chronic problem.  Well controlled on Amlodipine, Lisinopril HCTZ.  Currently asymptomatic.  Check labs.  No anticipated med changes.

## 2021-06-01 ENCOUNTER — Other Ambulatory Visit: Payer: Self-pay | Admitting: Family Medicine

## 2021-06-01 DIAGNOSIS — E038 Other specified hypothyroidism: Secondary | ICD-10-CM

## 2021-06-08 ENCOUNTER — Other Ambulatory Visit: Payer: Self-pay | Admitting: Family Medicine

## 2021-06-08 DIAGNOSIS — E785 Hyperlipidemia, unspecified: Secondary | ICD-10-CM

## 2021-06-11 ENCOUNTER — Encounter: Payer: Self-pay | Admitting: *Deleted

## 2021-06-30 ENCOUNTER — Ambulatory Visit: Payer: Medicare Other

## 2021-06-30 ENCOUNTER — Telehealth: Payer: Self-pay | Admitting: *Deleted

## 2021-06-30 NOTE — Telephone Encounter (Signed)
No answer call to reschedule due to system outage for AWV

## 2021-07-15 ENCOUNTER — Telehealth: Payer: Self-pay

## 2021-07-15 NOTE — Progress Notes (Signed)
Chronic Care Management Pharmacy Assistant   Name: Priscilla Thompson  MRN: [id number] DOB: 04-24-1951   Reason for Encounter: Disease State - Hypertension     Recent office visits:  05/02/21 Neena Rhymes, MD (PCP) - Family Medicine - Hypertension - Labs were ordered. No medication changes. Follow up in 6 months.   Recent consult visits:  None noted.   Hospital visits:  None in previous 6 months  Medications: Outpatient Encounter Medications as of 07/15/2021  Medication Sig   acetaminophen (TYLENOL) 650 MG CR tablet Take 1,300 mg by mouth every 8 (eight) hours as needed for pain (for arthritis).   amLODipine (NORVASC) 5 MG tablet TAKE 1 TABLET BY MOUTH EVERY DAY   aspirin 81 MG tablet Take 81 mg by mouth daily.   atorvastatin (LIPITOR) 20 MG tablet TAKE 1 TABLET BY MOUTH EVERY DAY   Calcium Carb-Cholecalciferol (CALCIUM-VITAMIN D) 500-400 MG-UNIT TABS Take 1 tablet by mouth daily.   Cholecalciferol (VITAMIN D3) 1000 UNITS CAPS Take 1 capsule by mouth daily.   cyclobenzaprine (FLEXERIL) 5 MG tablet Take 1 tablet (5 mg total) by mouth 3 (three) times daily as needed for muscle spasms.   diclofenac (VOLTAREN) 50 MG EC tablet TAKE 1 TABLET BY MOUTH TWICE A DAY   dorzolamide-timolol (COSOPT) 22.3-6.8 MG/ML ophthalmic solution Place 1 drop into both eyes 2 (two) times daily.    fish oil-omega-3 fatty acids 1000 MG capsule Take 1,000 mg by mouth daily.    latanoprost (XALATAN) 0.005 % ophthalmic solution Place 1 drop into both eyes at bedtime.    levothyroxine (SYNTHROID) 50 MCG tablet TAKE 1 TABLET BY MOUTH EVERY DAY   lisinopril-hydrochlorothiazide (ZESTORETIC) 20-25 MG tablet TAKE 1 TABLET BY MOUTH EVERY DAY   Multiple Vitamins-Minerals (MULTI FOR HER 50+ PO) Take 2 chew by mouth daily   omeprazole (PRILOSEC) 20 MG capsule Take 20 mg by mouth 3 (three) times a week.    potassium chloride SA (KLOR-CON M20) 20 MEQ tablet Take 1 tablet (20 mEq total) by mouth daily.   No  facility-administered encounter medications on file as of 07/15/2021.    Current antihypertensive regimen:  Amlodipine 5 mg once daily Lisinopril-HCTZ 20-25 mg once daily  How often are you checking your Blood Pressure?   Patient reported she does not check regularly but checks atleast weekly.  Current home BP readings: 128/78 (last week patient reported)   What recent interventions/DTPs have been made by any provider to improve Blood Pressure control since last CPP Visit:  Patient reported no changes in her current regimen have been made since her last CPP visit.    Any recent hospitalizations or ED visits since last visit with CPP?   Patient has not had any hospitalizations or Ed visits since last visit with CPP.   What diet changes have been made to improve Blood Pressure Control?  Patient reported she has been working on watching her sodium intake.    What exercise is being done to improve your Blood Pressure Control?  Patient reported she has been working on walking more.   Adherence Review: Is the patient currently on ACE/ARB medication? Yes Does the patient have >5 day gap between last estimated fill dates? No   Amlodipine 5 mg once daily - last filled 07/13/21 90 days  Lisinopril-HCTZ 20-25 mg once daily - last filled 07/13/21 90 days    Star Rating Drugs: lisinopril-hydrochlorothiazide (ZESTORETIC) 20-25 MG tablet - last filled 07/13/21 90 days  atorvastatin (LIPITOR) 20 MG tablet -  last filled 06/09/21 90 days    Eugenie Filler, CCMA Clinical Pharmacist Assistant  [phone]  Time Spent: 25 minutes

## 2021-08-29 ENCOUNTER — Other Ambulatory Visit: Payer: Self-pay | Admitting: Family Medicine

## 2021-08-29 DIAGNOSIS — I10 Essential (primary) hypertension: Secondary | ICD-10-CM

## 2021-09-06 ENCOUNTER — Ambulatory Visit: Payer: Medicare Other

## 2021-09-06 NOTE — Progress Notes (Deleted)
LVM - apologized that provider to complete the visit was unable to do so.

## 2021-09-15 ENCOUNTER — Telehealth: Payer: Self-pay

## 2021-09-15 NOTE — Progress Notes (Signed)
11   Chronic Care Management Pharmacy Assistant   Name: Priscilla Thompson  MRN: [id number] DOB: 05-07-1951   Reason for Encounter: Disease State - Hypertension Call    Recent office visits:  None noted.   Recent consult visits:  None noted.   Hospital visits:  None in previous 6 months  Medications: Outpatient Encounter Medications as of 09/15/2021  Medication Sig   acetaminophen (TYLENOL) 650 MG CR tablet Take 1,300 mg by mouth every 8 (eight) hours as needed for pain (for arthritis).   amLODipine (NORVASC) 5 MG tablet TAKE 1 TABLET BY MOUTH EVERY DAY   aspirin 81 MG tablet Take 81 mg by mouth daily.   atorvastatin (LIPITOR) 20 MG tablet TAKE 1 TABLET BY MOUTH EVERY DAY   Calcium Carb-Cholecalciferol (CALCIUM-VITAMIN D) 500-400 MG-UNIT TABS Take 1 tablet by mouth daily.   Cholecalciferol (VITAMIN D3) 1000 UNITS CAPS Take 1 capsule by mouth daily.   cyclobenzaprine (FLEXERIL) 5 MG tablet Take 1 tablet (5 mg total) by mouth 3 (three) times daily as needed for muscle spasms.   diclofenac (VOLTAREN) 50 MG EC tablet TAKE 1 TABLET BY MOUTH TWICE A DAY   dorzolamide-timolol (COSOPT) 22.3-6.8 MG/ML ophthalmic solution Place 1 drop into both eyes 2 (two) times daily.    fish oil-omega-3 fatty acids 1000 MG capsule Take 1,000 mg by mouth daily.    KLOR-CON M20 20 MEQ tablet TAKE 1 TABLET BY MOUTH EVERY DAY   latanoprost (XALATAN) 0.005 % ophthalmic solution Place 1 drop into both eyes at bedtime.    levothyroxine (SYNTHROID) 50 MCG tablet TAKE 1 TABLET BY MOUTH EVERY DAY   lisinopril-hydrochlorothiazide (ZESTORETIC) 20-25 MG tablet TAKE 1 TABLET BY MOUTH EVERY DAY   Multiple Vitamins-Minerals (MULTI FOR HER 50+ PO) Take 2 chew by mouth daily   omeprazole (PRILOSEC) 20 MG capsule Take 20 mg by mouth 3 (three) times a week.    No facility-administered encounter medications on file as of 09/15/2021.   Current antihypertensive regimen:  Amlodipine 5 mg once daily Lisinopril-HCTZ 20-25 mg  once daily  How often are you checking your Blood Pressure?  Patient reported she is checking once or twice a week   Current home BP readings: 131/78 (last week)   What recent interventions/DTPs have been made by any provider to improve Blood Pressure control since last CPP Visit:  Patient denied any recent changes in current regimen.    Any recent hospitalizations or ED visits since last visit with CPP?  Patient has not had any hospitalizations or ED visits since last visit with CPP.   What diet changes have been made to improve Blood Pressure Control?  Patient reported she does watch her sodium intake in her diet.    What exercise is being done to improve your Blood Pressure Control?  Patient reports she uses a mobile foot exercise cycle stationary since it has been so hot outside.     Adherence Review: Is the patient currently on ACE/ARB medication? Yes Does the patient have >5 day gap between last estimated fill dates? No  Amlodipine 5 mg once daily - last filled 07/13/21 90 days  Lisinopril-HCTZ 20-25 mg once daily - last filled 09/04/21 90 days    Care Gaps  AWV: done 11/01/20 Colonoscopy: done 07/06/18 DM Eye Exam: done 09/30/20 DM Foot Exam: unknown Microalbumin: overdue  HbgAIC:  done 05/02/21 (6.3) DEXA: done 03/30/19 Mammogram: done 09/28/19  Star Rating Drugs: Atorvastatin (LIPITOR) 20 MG tablet - last filled 09/04/21 90 days  Lisinopril-hydrochlorothiazide (ZESTORETIC) 20-25 MG tablet - last filled 07/13/21 90 days   Future Appointments  Date Time Provider Department Center  10/22/2021 10:30 AM [facility] CCM PHARMACIST [facility]  11/03/2021 10:00 AM Sheliah Hatch, MD [facility]    Eugenie Filler, [facility] Clinical Pharmacist Assistant  [phone]  Time Spent: 36 minutes

## 2021-09-30 DIAGNOSIS — E119 Type 2 diabetes mellitus without complications: Secondary | ICD-10-CM | POA: Diagnosis not present

## 2021-09-30 DIAGNOSIS — H04123 Dry eye syndrome of bilateral lacrimal glands: Secondary | ICD-10-CM | POA: Diagnosis not present

## 2021-09-30 DIAGNOSIS — H2513 Age-related nuclear cataract, bilateral: Secondary | ICD-10-CM | POA: Diagnosis not present

## 2021-09-30 DIAGNOSIS — H40023 Open angle with borderline findings, high risk, bilateral: Secondary | ICD-10-CM | POA: Diagnosis not present

## 2021-10-09 ENCOUNTER — Other Ambulatory Visit: Payer: Self-pay | Admitting: Family Medicine

## 2021-10-13 DIAGNOSIS — Z1231 Encounter for screening mammogram for malignant neoplasm of breast: Secondary | ICD-10-CM | POA: Diagnosis not present

## 2021-10-22 ENCOUNTER — Ambulatory Visit (INDEPENDENT_AMBULATORY_CARE_PROVIDER_SITE_OTHER): Payer: Medicare Other

## 2021-10-22 DIAGNOSIS — E785 Hyperlipidemia, unspecified: Secondary | ICD-10-CM

## 2021-10-22 DIAGNOSIS — E119 Type 2 diabetes mellitus without complications: Secondary | ICD-10-CM

## 2021-10-22 DIAGNOSIS — I10 Essential (primary) hypertension: Secondary | ICD-10-CM

## 2021-10-22 DIAGNOSIS — E1169 Type 2 diabetes mellitus with other specified complication: Secondary | ICD-10-CM

## 2021-10-22 NOTE — Progress Notes (Signed)
Chronic Care Management Pharmacy Note  10/22/2021 Name:  MATTELYN IMHOFF MRN:  [id number] DOB:  [birth year]  Summary: No rx changes  Subjective: Priscilla Thompson is an 70 y.o. year old female who is a primary patient of Tabori, Aundra Millet, MD.  The CCM team was consulted for assistance with disease management and care coordination needs.    Engaged with patient by telephone for follow up visit in response to provider referral for pharmacy case management and/or care coordination services.   Consent to Services:  The patient was given information about Chronic Care Management services, agreed to services, and gave verbal consent prior to initiation of services.  Please see initial visit note for detailed documentation.   Patient Care Team: Midge Minium, MD as PCP - Ventura Bruns, MD as Consulting Physician (Obstetrics and Gynecology) Clent Jacks, MD as Consulting Physician (Ophthalmology) Dyke Maes, [place] (Optometry) Juanita Craver, MD as Consulting Physician (Gastroenterology) Madelin Rear, [facility] as Pharmacist (Pharmacist)  Hospital visits: None in previous 6 months  Objective:  Lab Results  Component Value Date   CREATININE 0.82 05/02/2021   CREATININE 0.89 11/01/2020   CREATININE 0.88 07/29/2020    Lab Results  Component Value Date   HGBA1C 6.3 05/02/2021   HGBA1C 6.2 11/01/2020   HGBA1C 6.2 07/29/2020   Last diabetic Eye exam:  Lab Results  Component Value Date/Time   HMDIABEYEEXA No Retinopathy 09/17/2020 01:50 PM    Last diabetic Foot exam: No results found for: HMDIABFOOTEX      Component Value Date/Time   CHOL 141 05/02/2021 1118   CHOL 138 11/01/2020 1051   CHOL 140 03/25/2020 0905   TRIG 59.0 05/02/2021 1118   TRIG 64.0 11/01/2020 1051   TRIG 62.0 03/25/2020 0905   HDL 65.70 05/02/2021 1118   HDL 62.40 11/01/2020 1051   HDL 59.30 03/25/2020 0905   CHOLHDL 2 05/02/2021 1118   VLDL 11.8 05/02/2021 1118   LDLCALC 64  05/02/2021 1118   LDLCALC 63 11/01/2020 1051   LDLCALC 68 03/25/2020 0905   LDLDIRECT 131.4 08/18/2011 0832    Hepatic Function Latest Ref Rng & Units 05/02/2021 11/01/2020 03/25/2020  Total Protein 6.0 - 8.3 g/dL 7.1 7.3 7.2  Albumin 3.5 - 5.2 g/dL 4.3 4.3 4.4  AST 0 - 37 U/L '15 14 16  ' ALT 0 - 35 U/L '18 17 23  ' Alk Phosphatase 39 - 117 U/L 55 61 63  Total Bilirubin 0.2 - 1.2 mg/dL 0.6 0.7 0.6  Bilirubin, Direct 0.0 - 0.3 mg/dL 0.1 0.1 0.1    Lab Results  Component Value Date/Time   TSH 1.14 05/02/2021 11:18 AM   TSH 1.20 11/01/2020 10:51 AM   FREET4 1.05 01/13/2011 12:00 AM    CBC Latest Ref Rng & Units 05/02/2021 11/01/2020 03/25/2020  WBC 4.0 - 10.5 K/uL 6.6 6.6 6.7  Hemoglobin 12.0 - 15.0 g/dL 13.2 13.5 12.9  Hematocrit 36.0 - 46.0 % 39.9 40.7 39.1  Platelets 150.0 - 400.0 K/uL 224.0 250.0 238.0    Lab Results  Component Value Date/Time   VD25OH 57.93 11/01/2020 10:51 AM   VD25OH 61.61 07/17/2019 11:44 AM    Clinical ASCVD:  The 10-year ASCVD risk score (Arnett DK, et al., 2019) is: 20.7%   Values used to calculate the score:     Age: 89 years     Sex: Female     Is Non-Hispanic African American: Yes     Diabetic: Yes     Tobacco smoker: No  Systolic Blood Pressure: 875 mmHg     Is BP treated: Yes     HDL Cholesterol: 65.7 mg/dL     Total Cholesterol: 141 mg/dL   Social History   Tobacco Use  Smoking Status Never  Smokeless Tobacco Never   BP Readings from Last 3 Encounters:  05/02/21 122/80  11/01/20 126/70  07/29/20 124/81   Pulse Readings from Last 3 Encounters:  05/02/21 82  11/01/20 72  07/29/20 88   Wt Readings from Last 3 Encounters:  05/02/21 201 lb 9.6 oz (91.4 kg)  11/01/20 198 lb 12.8 oz (90.2 kg)  07/29/20 198 lb 8 oz (90 kg)   BMI Readings from Last 3 Encounters:  05/02/21 36.87 kg/m  11/01/20 36.36 kg/m  07/29/20 35.16 kg/m    Assessment: Review of patient past medical history, allergies, medications, health status,  including review of consultants reports, laboratory and other test data, was performed as part of comprehensive evaluation and provision of chronic care management services.   SDOH:  (Social Determinants of Health) assessments and interventions performed: Yes   CCM Care Plan  No Known Allergies  Medications Reviewed Today     Reviewed by Madelin Rear, [facility] (Pharmacist) on 10/22/21 at [place] List Status: <None>   Medication Order Taking? Sig Documenting Provider Last Dose Status Informant  acetaminophen (TYLENOL) 650 MG CR tablet [id number] No Take 1,300 mg by mouth every 8 (eight) hours as needed for pain (for arthritis). [provider] Taking Active   amLODipine (NORVASC) 5 MG tablet [id number]  TAKE 1 TABLET BY MOUTH EVERY DAY Midge Minium, MD  Active   aspirin 81 MG tablet [id number] No Take 81 mg by mouth daily. [provider] Taking Active   atorvastatin (LIPITOR) 20 MG tablet [id number]  TAKE 1 TABLET BY MOUTH EVERY DAY Midge Minium, MD  Active   Calcium Carb-Cholecalciferol (CALCIUM-VITAMIN D) 500-400 MG-UNIT TABS [id number] No Take 1 tablet by mouth daily. [provider] Taking Active   Cholecalciferol (VITAMIN D3) 1000 UNITS CAPS [id number] No Take 1 capsule by mouth daily. [provider] Taking Active   cyclobenzaprine (FLEXERIL) 5 MG tablet [id number] No Take 1 tablet (5 mg total) by mouth 3 (three) times daily as needed for muscle spasms. Midge Minium, MD Taking Active   diclofenac (VOLTAREN) 50 MG EC tablet [id number] No TAKE 1 TABLET BY MOUTH TWICE A DAY Midge Minium, MD Taking Active   dorzolamide-timolol (COSOPT) 22.3-6.8 MG/ML ophthalmic solution [id number] No Place 1 drop into both eyes 2 (two) times daily.  [provider] Taking Active   fish oil-omega-3 fatty acids 1000 MG capsule [id number] No Take 1,000 mg by mouth daily.  [provider] Taking Active   KLOR-CON M20 20 MEQ tablet [id number]  TAKE 1  TABLET BY MOUTH EVERY DAY Midge Minium, MD  Active   latanoprost (XALATAN) 0.005 % ophthalmic solution [id number] No Place 1 drop into both eyes at bedtime.  [provider] Taking Active   levothyroxine (SYNTHROID) 50 MCG tablet [id number]  TAKE 1 TABLET BY MOUTH EVERY DAY Midge Minium, MD  Active   lisinopril-hydrochlorothiazide (ZESTORETIC) 20-25 MG tablet [id number]  TAKE 1 TABLET BY MOUTH EVERY DAY Midge Minium, MD  Active   Multiple Vitamins-Minerals (MULTI FOR HER 50+ PO) [id number] No Take 2 chew by mouth daily [provider] Taking Active   omeprazole (PRILOSEC) 20 MG capsule [id number] No Take 20 mg by mouth 3 (three) times a week.  [provider] Taking Active             Patient Active Problem List   Diagnosis Date Noted   Hyperlipidemia associated with type 2 diabetes mellitus ([place]) 07/17/2019   Morbid obesity ([place]) 06/23/2018   Grief 01/12/2013   Rhinitis 01/27/2012   General medical examination 08/18/2011   HAND PAIN, BILATERAL 02/16/2011   ANKLE EDEMA 02/16/2011   Diet-controlled diabetes mellitus ([place]) 02/02/2011   BACK PAIN 02/01/2009   Hypothyroidism 01/25/2009   Vitamin D deficiency 01/25/2009   GLAUCOMA NOS 01/25/2009   Essential hypertension 01/25/2009    Immunization History  Administered Date(s) Administered   Fluad Quad(high Dose 65+) 08/08/2019, 11/01/2020   Influenza Split 12/23/2012   Influenza,inj,Quad PF,6+ Mos 11/06/2013, 10/08/2014, 09/23/2015, 09/10/2016, 09/17/2017, 07/29/2018   PFIZER SARS-COV-2 Pediatric Vaccination 5-68yr 10/25/2020   PFIZER(Purple Top)SARS-COV-2 Vaccination 02/26/2020, 03/20/2020, 10/25/2020   Pneumococcal Conjugate-13 09/17/2017   Pneumococcal Polysaccharide-23 10/08/2014   Tdap 04/14/2013   Zoster, Live 11/20/2014    Conditions to be addressed/monitored: HLD HTN DMII  [place] : [place]  Updates made by [facility]adelin Rear [facility] 10/22/2021 12:00 AM      Problem: Diet controlled DM, hypothyroidism, HLD, ankle edema   Priority: High     Long-Range Goal: Disease Management   Start Date: 04/18/2021  Expected End Date: 04/18/2022  Recent Progress: On track  Priority: High  Note:   Current Barriers:  N/a  Pharmacist Clinical Goal(s):  Patient will verbalize ability to afford treatment regimen achieve adherence to monitoring guidelines and medication adherence to achieve therapeutic efficacy through collaboration with PharmD and provider.   Interventions: 1:1 collaboration with TMidge Minium MD regarding development and update of comprehensive plan of care as evidenced by provider attestation and co-signature Inter-disciplinary care team collaboration (see longitudinal plan of care) Comprehensive medication review performed; medication list updated in electronic medical record  ypertension (BP goal <130/80) -Controlled -Current treatment: Amlodipine 5 mg once daily Lisinopril-HCTZ 20-25 mg once daily  -Current home readings: consistently at goal. No fill gaps.  -Denies hypotensive/hypertensive symptoms.  -Reviewed for side effects - no problems noted.  -Educated on BP goals and benefits of medications for prevention of heart attack, stroke and kidney damage; -Counseled to monitor BP at home 1x/wk as already doing, document, and provide log at future appointments -Recommended to continue current medication  Hyperlipidemia: (LDL goal < 100) -Controlled -Current treatment: Atorvastatin 20 mg once daily before bed  -Side effect review - no problems noted -Educated on Cholesterol goals;  -Recommended to continue current medication  Diabetes (A1c goal <6.5%) -Controlled -Current medications: N/a - diet only  --Current meal patterns: vegetables, salad, fruit. Trying to drink a lot of water.  -Counseled on low carb diet -Educated on A1c and blood sugar goals; -Exercise: some walking outdoors and indoors at the  store -Counseled to check feet daily and get yearly eye exams -Recommended to continue current medication  Care gaps -mammogram 10/13/2021 completed per pt -possibly had eye exam 09/30/2021, but might not have been DM    Patient Goals/Self-Care Activities Patient will:  - take medications as prescribed as evidenced by patient report and record review  Medication Assistance: None required.  Patient affirms current coverage meets needs.  Patient's [facility] pharmacy [facility]  [id number] [place]: [phone]: [phone] endorses 100% compliance  Follow Up:  Patient agrees to Care Plan and Follow-up.  Plan: HC 6 month BP. Pharmacist 31yrf/u  Future Appointments  Date Time Provider DEzel 11/03/2021 10:00 AM [facility]idge Minium MD [facility] 10/21/2022  9:15 AM [facility] CCM PHARMACIST [facility]    JMadelin Rear PharmD, CPP Clinical Pharmacist Practitioner  [place] ([phone]

## 2021-10-22 NOTE — Patient Instructions (Signed)
Priscilla Thompson,  Thank you for talking with me today. I have included our care plan/goals in the following pages.   Please review and call me at [phone] with any questions.  Thanks! [name], PharmD, CPP Clinical Pharmacist Practitioner  [phone]  Care Plan : CCM Pharmacy Care Plam  Updates made by Priscilla Thompson, [facility] since 10/22/2021 12:00 AM     Problem: Diet controlled DM, hypothyroidism, HLD, ankle edema   Priority: High     Long-Range Goal: Disease Management   Start Date: 04/18/2021  Expected End Date: 04/18/2022  Recent Progress: On track  Priority: High  Note:   Current Barriers:  N/a  Pharmacist Clinical Goal(s):  Patient will verbalize ability to afford treatment regimen achieve adherence to monitoring guidelines and medication adherence to achieve therapeutic efficacy through collaboration with PharmD and provider.   Interventions: 1:1 collaboration with Sheliah Hatch, MD regarding development and update of comprehensive plan of care as evidenced by provider attestation and co-signature Inter-disciplinary care team collaboration (see longitudinal plan of care) Comprehensive medication review performed; medication list updated in electronic medical record  ypertension (BP goal <130/80) -Controlled -Current treatment: Amlodipine 5 mg once daily Lisinopril-HCTZ 20-25 mg once daily  -Current home readings: consistently at goal. No fill gaps.  -Denies hypotensive/hypertensive symptoms.  -Reviewed for side effects - no problems noted.  -Educated on BP goals and benefits of medications for prevention of heart attack, stroke and kidney damage; -Counseled to monitor BP at home 1x/wk as already doing, document, and provide log at future appointments -Recommended to continue current medication  Hyperlipidemia: (LDL goal < 100) -Controlled -Current treatment: Atorvastatin 20 mg once daily before bed  -Side effect review - no problems  noted -Educated on Cholesterol goals;  -Recommended to continue current medication  Diabetes (A1c goal <6.5%) -Controlled -Current medications: N/a - diet only  --Current meal patterns: vegetables, salad, fruit. Trying to drink a lot of water.  -Counseled on low carb diet -Educated on A1c and blood sugar goals; -Exercise: some walking outdoors and indoors at the store -Counseled to check feet daily and get yearly eye exams -Recommended to continue current medication  Care gaps -mammogram 10/13/2021 completed per pt -possibly had eye exam 09/30/2021, but might not have been DM    The patient verbalized understanding of instructions provided today and agreed to receive a MyChart copy of patient instruction and/or educational materials. Telephone follow up appointment with pharmacy team member scheduled for: See next appointment with "Care Management Staff" under "What's Next" below.

## 2021-11-02 ENCOUNTER — Encounter: Payer: Self-pay | Admitting: *Deleted

## 2021-11-03 ENCOUNTER — Encounter: Payer: Medicare Other | Admitting: Family Medicine

## 2021-11-12 DIAGNOSIS — E1169 Type 2 diabetes mellitus with other specified complication: Secondary | ICD-10-CM | POA: Diagnosis not present

## 2021-11-12 DIAGNOSIS — E119 Type 2 diabetes mellitus without complications: Secondary | ICD-10-CM | POA: Diagnosis not present

## 2021-11-12 DIAGNOSIS — I10 Essential (primary) hypertension: Secondary | ICD-10-CM | POA: Diagnosis not present

## 2021-11-12 DIAGNOSIS — E785 Hyperlipidemia, unspecified: Secondary | ICD-10-CM | POA: Diagnosis not present

## 2021-11-18 ENCOUNTER — Ambulatory Visit (INDEPENDENT_AMBULATORY_CARE_PROVIDER_SITE_OTHER): Payer: Medicare Other | Admitting: Family Medicine

## 2021-11-18 ENCOUNTER — Encounter: Payer: Self-pay | Admitting: Family Medicine

## 2021-11-18 VITALS — BP 128/80 | HR 84 | Temp 97.8°F | Resp 16 | Ht 62.0 in | Wt 206.6 lb

## 2021-11-18 DIAGNOSIS — Z23 Encounter for immunization: Secondary | ICD-10-CM

## 2021-11-18 DIAGNOSIS — Z0000 Encounter for general adult medical examination without abnormal findings: Secondary | ICD-10-CM

## 2021-11-18 DIAGNOSIS — E559 Vitamin D deficiency, unspecified: Secondary | ICD-10-CM

## 2021-11-18 DIAGNOSIS — E119 Type 2 diabetes mellitus without complications: Secondary | ICD-10-CM

## 2021-11-18 DIAGNOSIS — K219 Gastro-esophageal reflux disease without esophagitis: Secondary | ICD-10-CM | POA: Insufficient documentation

## 2021-11-18 DIAGNOSIS — Z860100 Personal history of colon polyps, unspecified: Secondary | ICD-10-CM | POA: Insufficient documentation

## 2021-11-18 DIAGNOSIS — Z1159 Encounter for screening for other viral diseases: Secondary | ICD-10-CM

## 2021-11-18 LAB — CBC WITH DIFFERENTIAL/PLATELET
Basophils Absolute: 0 10*3/uL (ref 0.0–0.1)
Basophils Relative: 0.4 % (ref 0.0–3.0)
Eosinophils Absolute: 0.1 10*3/uL (ref 0.0–0.7)
Eosinophils Relative: 1.4 % (ref 0.0–5.0)
HCT: 40.6 % (ref 36.0–46.0)
Hemoglobin: 13.3 g/dL (ref 12.0–15.0)
Lymphocytes Relative: 37.4 % (ref 12.0–46.0)
Lymphs Abs: 2.4 10*3/uL (ref 0.7–4.0)
MCHC: 32.7 g/dL (ref 30.0–36.0)
MCV: 86.7 fl (ref 78.0–100.0)
Monocytes Absolute: 0.5 10*3/uL (ref 0.1–1.0)
Monocytes Relative: 7.2 % (ref 3.0–12.0)
Neutro Abs: 3.4 10*3/uL (ref 1.4–7.7)
Neutrophils Relative %: 53.6 % (ref 43.0–77.0)
Platelets: 237 10*3/uL (ref 150.0–400.0)
RBC: 4.68 Mil/uL (ref 3.87–5.11)
RDW: 13.4 % (ref 11.5–15.5)
WBC: 6.4 10*3/uL (ref 4.0–10.5)

## 2021-11-18 LAB — LIPID PANEL
Cholesterol: 122 mg/dL (ref 0–200)
HDL: 62.2 mg/dL (ref >39.00)
LDL Cholesterol: 48 mg/dL (ref 0–99)
NonHDL: 59.31
Total CHOL/HDL Ratio: 2
Triglycerides: 59 mg/dL (ref 0.0–149.0)
VLDL: 11.8 mg/dL (ref 0.0–40.0)

## 2021-11-18 LAB — BASIC METABOLIC PANEL
BUN: 12 mg/dL (ref 6–23)
CO2: 27 mEq/L (ref 19–32)
Calcium: 9.6 mg/dL (ref 8.4–10.5)
Chloride: 103 mEq/L (ref 96–112)
Creatinine, Ser: 0.85 mg/dL (ref 0.40–1.20)
GFR: 69.03 mL/min (ref >60.00)
Glucose, Bld: 80 mg/dL (ref 70–99)
Potassium: 3.3 mEq/L — ABNORMAL LOW (ref 3.5–5.1)
Sodium: 140 mEq/L (ref 135–145)

## 2021-11-18 LAB — HEPATIC FUNCTION PANEL
ALT: 18 U/L (ref 0–35)
AST: 16 U/L (ref 0–37)
Albumin: 4.2 g/dL (ref 3.5–5.2)
Alkaline Phosphatase: 55 U/L (ref 39–117)
Bilirubin, Direct: 0.2 mg/dL (ref 0.0–0.3)
Total Bilirubin: 0.8 mg/dL (ref 0.2–1.2)
Total Protein: 6.9 g/dL (ref 6.0–8.3)

## 2021-11-18 LAB — HEMOGLOBIN A1C: Hgb A1c MFr Bld: 6.4 % (ref 4.6–6.5)

## 2021-11-18 NOTE — Assessment & Plan Note (Signed)
Chronic problem.  Has been able to control w/ diet.  UTD on foot exam, eye exam, microalbumin.  Check labs and adjust tx prn.

## 2021-11-18 NOTE — Assessment & Plan Note (Signed)
Pt's PE WNL w/ exception of obesity.  UTD on eye exam, mammo, foot exam, DEXA, colonoscopy.  UTD on PNA and Tdap.  Flu given.  Check labs.  Anticipatory guidance provided.

## 2021-11-18 NOTE — Progress Notes (Signed)
Subjective:    Patient ID: Priscilla Thompson, female    DOB: 1951-10-18, 70 y.o.   MRN: [id number]  HPI CPE- due for flu shot.  UTD on eye exam, mammo, foot exam, DEXA, colonoscopy.  UTD on Pneumonia vaccines and Tdap.  No concerns today.  Patient Care Team    Relationship Specialty Notifications Start End  Sheliah Hatch, MD PCP - General   11/26/10   Marcelle Overlie, MD Consulting Physician Obstetrics and Gynecology  06/14/15   Ernesto Rutherford, MD Consulting Physician Ophthalmology  06/14/15   Elliot Cousin, OD  Optometry  06/14/15   Charna Elizabeth, MD Consulting Physician Gastroenterology  06/14/15   Dahlia Byes, [facility] Pharmacist Pharmacist  03/18/20    Comment: phone number [phone]    Health Maintenance  Topic Date Due   Hepatitis C Screening  Never done   Zoster Vaccines- Shingrix (1 of 2) Never done   Pneumonia Vaccine 61+ Years old (3 - PPSV23 if available, else PCV20) 10/09/2019   COVID-19 Vaccine (4 - Booster for Pfizer series) 12/20/2020   INFLUENZA VACCINE  07/14/2021   HEMOGLOBIN A1C  11/02/2021   OPHTHALMOLOGY EXAM  09/30/2022   MAMMOGRAM  10/13/2022   FOOT EXAM  11/18/2022   DEXA SCAN  04/02/2023   TETANUS/TDAP  04/15/2023   COLONOSCOPY (Pts 45-73yrs Insurance coverage will need to be confirmed)  07/07/2023   HPV VACCINES  Aged Out      Review of Systems Patient reports no vision/ hearing changes, adenopathy,fever, weight change,  persistant/recurrent hoarseness , swallowing issues, chest pain, palpitations, edema, persistant/recurrent cough, hemoptysis, dyspnea (rest/exertional/paroxysmal nocturnal), gastrointestinal bleeding (melena, rectal bleeding), abdominal pain, significant heartburn, bowel changes, GU symptoms (dysuria, hematuria, incontinence), Gyn symptoms (abnormal  bleeding, pain),  syncope, focal weakness, memory loss, numbness & tingling, skin/hair/nail changes, abnormal bruising or bleeding, anxiety, or depression.   This visit occurred during the  SARS-CoV-2 public health emergency.  Safety protocols were in place, including screening questions prior to the visit, additional usage of staff PPE, and extensive cleaning of exam room while observing appropriate contact time as indicated for disinfecting solutions.      Objective:   Physical Exam General Appearance:    Alert, cooperative, no distress, appears stated age, obese  Head:    Normocephalic, without obvious abnormality, atraumatic  Eyes:    PERRL, conjunctiva/corneas clear, EOM's intact, fundi    benign, both eyes  Ears:    Normal TM's and external ear canals, both ears  Nose:   Deferred due to COVID  Throat:   Neck:   Supple, symmetrical, trachea midline, no adenopathy;    Thyroid: no enlargement/tenderness/nodules  Back:     Symmetric, no curvature, ROM normal, no CVA tenderness  Lungs:     Clear to auscultation bilaterally, respirations unlabored  Chest Wall:    No tenderness or deformity   Heart:    Regular rate and rhythm, S1 and S2 normal, no murmur, rub   or gallop  Breast Exam:    Deferred to GYN  Abdomen:     Soft, non-tender, bowel sounds active all four quadrants,    no masses, no organomegaly  Genitalia:    Deferred to GYN  Rectal:    Extremities:   Extremities normal, atraumatic, no cyanosis or edema  Pulses:   2+ and symmetric all extremities  Skin:   Skin color, texture, turgor normal, no rashes or lesions  Lymph nodes:   Cervical, supraclavicular, and axillary nodes normal  Neurologic:  CNII-XII intact, normal strength, sensation and reflexes    throughout          Assessment & Plan:

## 2021-11-18 NOTE — Patient Instructions (Signed)
Follow up in 6 months to recheck diabetes, BP, and cholesterol We'll notify you of your lab results and make any changes if needed Continue to work on healthy diet and regular exercise- you can do it! Call with any questions or concerns Stay Safe!  Stay Healthy! Happy Holidays!!!

## 2021-11-18 NOTE — Assessment & Plan Note (Signed)
Check labs and replete prn. 

## 2021-11-19 LAB — HEPATITIS C ANTIBODY
Hepatitis C Ab: NONREACTIVE
SIGNAL TO CUT-OFF: 0.09 (ref <1.00)

## 2021-11-19 LAB — TSH: TSH: 1.1 u[IU]/mL (ref 0.35–5.50)

## 2021-11-19 LAB — VITAMIN D 25 HYDROXY (VIT D DEFICIENCY, FRACTURES): VITD: 69.6 ng/mL (ref 30.00–100.00)

## 2021-11-23 ENCOUNTER — Other Ambulatory Visit: Payer: Self-pay | Admitting: Family Medicine

## 2021-11-23 DIAGNOSIS — E038 Other specified hypothyroidism: Secondary | ICD-10-CM

## 2021-11-23 DIAGNOSIS — I10 Essential (primary) hypertension: Secondary | ICD-10-CM

## 2021-12-04 ENCOUNTER — Other Ambulatory Visit: Payer: Self-pay | Admitting: Family Medicine

## 2021-12-04 DIAGNOSIS — E785 Hyperlipidemia, unspecified: Secondary | ICD-10-CM

## 2022-02-02 ENCOUNTER — Encounter: Payer: Self-pay | Admitting: Family Medicine

## 2022-02-18 ENCOUNTER — Other Ambulatory Visit: Payer: Self-pay | Admitting: Family Medicine

## 2022-02-18 DIAGNOSIS — I10 Essential (primary) hypertension: Secondary | ICD-10-CM

## 2022-04-04 ENCOUNTER — Other Ambulatory Visit: Payer: Self-pay | Admitting: Family Medicine

## 2022-05-16 ENCOUNTER — Other Ambulatory Visit: Payer: Self-pay | Admitting: Family Medicine

## 2022-05-16 DIAGNOSIS — E038 Other specified hypothyroidism: Secondary | ICD-10-CM

## 2022-05-16 DIAGNOSIS — I10 Essential (primary) hypertension: Secondary | ICD-10-CM

## 2022-05-18 ENCOUNTER — Encounter: Payer: Self-pay | Admitting: Family Medicine

## 2022-05-18 ENCOUNTER — Ambulatory Visit (INDEPENDENT_AMBULATORY_CARE_PROVIDER_SITE_OTHER): Payer: Medicare Other | Admitting: Family Medicine

## 2022-05-18 VITALS — BP 130/80 | HR 77 | Temp 98.1°F | Resp 17 | Ht 62.0 in | Wt 206.0 lb

## 2022-05-18 DIAGNOSIS — Z23 Encounter for immunization: Secondary | ICD-10-CM

## 2022-05-18 DIAGNOSIS — E1169 Type 2 diabetes mellitus with other specified complication: Secondary | ICD-10-CM | POA: Diagnosis not present

## 2022-05-18 DIAGNOSIS — I10 Essential (primary) hypertension: Secondary | ICD-10-CM | POA: Diagnosis not present

## 2022-05-18 DIAGNOSIS — E119 Type 2 diabetes mellitus without complications: Secondary | ICD-10-CM | POA: Diagnosis not present

## 2022-05-18 DIAGNOSIS — E785 Hyperlipidemia, unspecified: Secondary | ICD-10-CM

## 2022-05-18 LAB — BASIC METABOLIC PANEL
BUN: 12 mg/dL (ref 6–23)
CO2: 29 mEq/L (ref 19–32)
Calcium: 9.8 mg/dL (ref 8.4–10.5)
Chloride: 103 mEq/L (ref 96–112)
Creatinine, Ser: 0.92 mg/dL (ref 0.40–1.20)
GFR: 62.55 mL/min (ref >60.00)
Glucose, Bld: 99 mg/dL (ref 70–99)
Potassium: 3.9 mEq/L (ref 3.5–5.1)
Sodium: 140 mEq/L (ref 135–145)

## 2022-05-18 LAB — LIPID PANEL
Cholesterol: 134 mg/dL (ref 0–200)
HDL: 60.3 mg/dL (ref >39.00)
LDL Cholesterol: 61 mg/dL (ref 0–99)
NonHDL: 73.47
Total CHOL/HDL Ratio: 2
Triglycerides: 64 mg/dL (ref 0.0–149.0)
VLDL: 12.8 mg/dL (ref 0.0–40.0)

## 2022-05-18 LAB — CBC WITH DIFFERENTIAL/PLATELET
Basophils Absolute: 0 10*3/uL (ref 0.0–0.1)
Basophils Relative: 0.5 % (ref 0.0–3.0)
Eosinophils Absolute: 0.1 10*3/uL (ref 0.0–0.7)
Eosinophils Relative: 2.4 % (ref 0.0–5.0)
HCT: 39.9 % (ref 36.0–46.0)
Hemoglobin: 13.1 g/dL (ref 12.0–15.0)
Lymphocytes Relative: 40.7 % (ref 12.0–46.0)
Lymphs Abs: 2.5 10*3/uL (ref 0.7–4.0)
MCHC: 32.8 g/dL (ref 30.0–36.0)
MCV: 85.8 fl (ref 78.0–100.0)
Monocytes Absolute: 0.4 10*3/uL (ref 0.1–1.0)
Monocytes Relative: 6.9 % (ref 3.0–12.0)
Neutro Abs: 3 10*3/uL (ref 1.4–7.7)
Neutrophils Relative %: 49.5 % (ref 43.0–77.0)
Platelets: 234 10*3/uL (ref 150.0–400.0)
RBC: 4.65 Mil/uL (ref 3.87–5.11)
RDW: 13.9 % (ref 11.5–15.5)
WBC: 6 10*3/uL (ref 4.0–10.5)

## 2022-05-18 LAB — HEPATIC FUNCTION PANEL
ALT: 16 U/L (ref 0–35)
AST: 18 U/L (ref 0–37)
Albumin: 4.2 g/dL (ref 3.5–5.2)
Alkaline Phosphatase: 54 U/L (ref 39–117)
Bilirubin, Direct: 0.1 mg/dL (ref 0.0–0.3)
Total Bilirubin: 0.7 mg/dL (ref 0.2–1.2)
Total Protein: 7.5 g/dL (ref 6.0–8.3)

## 2022-05-18 LAB — TSH: TSH: 1.41 u[IU]/mL (ref 0.35–5.50)

## 2022-05-18 LAB — MICROALBUMIN / CREATININE URINE RATIO
Creatinine,U: 37.4 mg/dL
Microalb Creat Ratio: 1.9 mg/g (ref 0.0–30.0)
Microalb, Ur: <0.7 mg/dL (ref 0.0–1.9)

## 2022-05-18 LAB — HEMOGLOBIN A1C: Hgb A1c MFr Bld: 6.3 % (ref 4.6–6.5)

## 2022-05-18 NOTE — Assessment & Plan Note (Signed)
Chronic problem.  Tolerating Lipitor 20mg daily w/o difficulty.  Check labs.  Adjust meds prn  

## 2022-05-18 NOTE — Patient Instructions (Signed)
Schedule your complete physical in 6 months We'll notify you of your lab results and make any changes if needed Continue to work on healthy diet and regular exercise- you can do it! Call with any questions or concerns Stay Safe!  Stay Healthy! Have a great summer!!! 

## 2022-05-18 NOTE — Assessment & Plan Note (Signed)
Chronic problem.  Currently doing well on Amlodipine 5mg  daily, Lisinopril HCTZ 20/25mg  daily w/ good control.  Asymptomatic.  Check labs due to ACE and diuretic but no anticipated med changes.  Will follow.

## 2022-05-18 NOTE — Assessment & Plan Note (Signed)
Chronic problem.  Currently diet controlled.  UTD on eye exam, foot exam.  Will get microalbumin despite pt being on ACE for renal protection.  Check A1C and determine if meds are needed.  Encouraged low carb diet and regular physical activity.  Pt expressed understanding and is in agreement w/ plan.

## 2022-05-18 NOTE — Progress Notes (Signed)
   Subjective:    Patient ID: Priscilla Thompson, female    DOB: March 16, 1951, 71 y.o.   MRN: [id number]  HPI DM- chronic problem.  Currently diet controlled.  UTD on eye exam, foot exam.  On ACE for renal protection.  Due for microalbumin.  Denies numbness/tingling of hands/feet.  HTN- chronic problem, on Amlodipine 5mg  daily, Lisinopril HCTZ 20/25mg  daily w/ adequate control.  Denies CP, SOB, HAs, visual changes, edema.  Hyperlipidemia- chronic problem, on Lipitor 20mg  daily.  Denies abd pain, N/V.  Obesity- pt's BMI steady at 37.79   Review of Systems For ROS see HPI     Objective:   Physical Exam Vitals reviewed.  Constitutional:      General: She is not in acute distress.    Appearance: Normal appearance. She is well-developed. She is obese.  HENT:     Head: Normocephalic and atraumatic.  Eyes:     Conjunctiva/sclera: Conjunctivae normal.     Pupils: Pupils are equal, round, and reactive to light.  Neck:     Thyroid: No thyromegaly.  Cardiovascular:     Rate and Rhythm: Normal rate and regular rhythm.     Pulses: Normal pulses.     Heart sounds: Normal heart sounds. No murmur heard. Pulmonary:     Effort: Pulmonary effort is normal. No respiratory distress.     Breath sounds: Normal breath sounds.  Abdominal:     General: There is no distension.     Palpations: Abdomen is soft.     Tenderness: There is no abdominal tenderness.  Musculoskeletal:     Cervical back: Normal range of motion and neck supple.     Right lower leg: No edema.     Left lower leg: No edema.  Lymphadenopathy:     Cervical: No cervical adenopathy.  Skin:    General: Skin is warm and dry.  Neurological:     Mental Status: She is alert and oriented to person, place, and time.  Psychiatric:        Behavior: Behavior normal.          Assessment & Plan:

## 2022-05-18 NOTE — Assessment & Plan Note (Signed)
Ongoing issue.  BMI is 37.68 but given her other medical conditions this qualifies as morbidly obese.  Encouraged low carb diet and regular physical activity.  Will follow.

## 2022-05-19 ENCOUNTER — Ambulatory Visit: Payer: Medicare Other | Admitting: Family Medicine

## 2022-05-20 ENCOUNTER — Telehealth: Payer: Self-pay

## 2022-05-20 NOTE — Telephone Encounter (Signed)
Pt advised of lab results.  

## 2022-05-20 NOTE — Telephone Encounter (Signed)
-----   Message from Sheliah Hatch, MD sent at 05/20/2022  7:47 AM EDT ----- Labs look great!  No changes

## 2022-05-25 ENCOUNTER — Encounter: Payer: Medicare Other | Admitting: Family Medicine

## 2022-05-26 ENCOUNTER — Other Ambulatory Visit: Payer: Self-pay | Admitting: Family Medicine

## 2022-05-26 DIAGNOSIS — E1169 Type 2 diabetes mellitus with other specified complication: Secondary | ICD-10-CM

## 2022-07-23 ENCOUNTER — Ambulatory Visit (INDEPENDENT_AMBULATORY_CARE_PROVIDER_SITE_OTHER): Payer: Medicare Other

## 2022-07-23 DIAGNOSIS — Z0000 Encounter for general adult medical examination without abnormal findings: Secondary | ICD-10-CM | POA: Diagnosis not present

## 2022-07-23 NOTE — Progress Notes (Signed)
Subjective:   Priscilla Thompson is a 71 y.o. female who presents for Medicare Annual (Subsequent) preventive examination.   I connected with Stevan Born today by telephone and verified that I am speaking with the correct person using two identifiers. Location patient: home Location provider: work Persons participating in the virtual visit: patient, provider.   I discussed the limitations, risks, security and privacy concerns of performing an evaluation and management service by telephone and the availability of in person appointments. I also discussed with the patient that there may be a patient responsible charge related to this service. The patient expressed understanding and verbally consented to this telephonic visit.    Interactive audio and video telecommunications were attempted between this provider and patient, however failed, due to patient having technical difficulties OR patient did not have access to video capability.  We continued and completed visit with audio only.    Review of Systems     Cardiac Risk Factors include: advanced age (>34men, >61 women);diabetes mellitus     Objective:    Today's Vitals   There is no height or weight on file to calculate BMI.     07/23/2022    9:04 AM 06/24/2020    9:55 AM  Advanced Directives  Does Patient Have a Medical Advance Directive? No No  Would patient like information on creating a medical advance directive? No - Patient declined Yes (MAU/Ambulatory/Procedural Areas - Information given)    Current Medications (verified) Outpatient Encounter Medications as of 07/23/2022  Medication Sig   acetaminophen (TYLENOL) 650 MG CR tablet Take 1,300 mg by mouth every 8 (eight) hours as needed for pain (for arthritis).   amLODipine (NORVASC) 5 MG tablet TAKE 1 TABLET BY MOUTH EVERY DAY   aspirin 81 MG tablet Take 81 mg by mouth daily.   atorvastatin (LIPITOR) 20 MG tablet TAKE 1 TABLET BY MOUTH EVERY DAY   Calcium  Carb-Cholecalciferol (CALCIUM-VITAMIN D) 500-400 MG-UNIT TABS Take 1 tablet by mouth daily.   Cholecalciferol (VITAMIN D3) 1000 UNITS CAPS Take 1 capsule by mouth daily.   cyclobenzaprine (FLEXERIL) 5 MG tablet Take 1 tablet (5 mg total) by mouth 3 (three) times daily as needed for muscle spasms.   diclofenac (VOLTAREN) 50 MG EC tablet TAKE 1 TABLET BY MOUTH TWICE A DAY   dorzolamide-timolol (COSOPT) 22.3-6.8 MG/ML ophthalmic solution Place 1 drop into both eyes 2 (two) times daily.    fish oil-omega-3 fatty acids 1000 MG capsule Take 1,000 mg by mouth daily.    KLOR-CON M20 20 MEQ tablet TAKE 1 TABLET BY MOUTH EVERY DAY   latanoprost (XALATAN) 0.005 % ophthalmic solution Place 1 drop into both eyes at bedtime.    levothyroxine (SYNTHROID) 50 MCG tablet TAKE 1 TABLET BY MOUTH EVERY DAY   lisinopril-hydrochlorothiazide (ZESTORETIC) 20-25 MG tablet TAKE 1 TABLET BY MOUTH EVERY DAY   Multiple Vitamins-Minerals (MULTI FOR HER 50+ PO) Take 2 chew by mouth daily   omeprazole (PRILOSEC) 20 MG capsule Take 20 mg by mouth 3 (three) times a week.    No facility-administered encounter medications on file as of 07/23/2022.    Allergies (verified) Patient has no known allergies.   History: Past Medical History:  Diagnosis Date   COVID-19    Diabetes mellitus    Hyperlipidemia    Hypertension    Hypothyroid    Osteoarthritis    Thyroid disease    History reviewed. No pertinent surgical history. Family History  Problem Relation Age of Onset  Diabetes Sister    Diabetes Brother    Cancer Brother        liver, lung   Social History   Socioeconomic History   Marital status: Single    Spouse name: Not on file   Number of children: Not on file   Years of education: Not on file   Highest education level: Not on file  Occupational History   Not on file  Tobacco Use   Smoking status: Never   Smokeless tobacco: Never  Vaping Use   Vaping Use: Never used  Substance and Sexual Activity    Alcohol use: Yes    Alcohol/week: 0.0 standard drinks of alcohol    Comment: socially   Drug use: No   Sexual activity: Not on file  Other Topics Concern   Not on file  Social History Narrative   Not on file   Social Determinants of Health   Financial Resource Strain: Low Risk  (07/23/2022)   Overall Financial Resource Strain (CARDIA)    Difficulty of Paying Living Expenses: Not hard at all  Food Insecurity: No Food Insecurity (07/23/2022)   Hunger Vital Sign    Worried About Running Out of Food in the Last Year: Never true    Ran Out of Food in the Last Year: Never true  Transportation Needs: No Transportation Needs (07/23/2022)   PRAPARE - Hydrologist (Medical): No    Lack of Transportation (Non-Medical): No  Physical Activity: Insufficiently Active (07/23/2022)   Exercise Vital Sign    Days of Exercise per Week: 3 days    Minutes of Exercise per Session: 30 min  Stress: No Stress Concern Present (07/23/2022)   [place]    Feeling of Stress : Not at all  Social Connections: Moderately Integrated (07/23/2022)   Social Connection and Isolation Panel [NHANES]    Frequency of Communication with Friends and Family: Three times a week    Frequency of Social Gatherings with Friends and Family: Three times a week    Attends Religious Services: More than 4 times per year    Active Member of Clubs or Organizations: Yes    Attends Music therapist: More than 4 times per year    Marital Status: Never married    Tobacco Counseling Counseling given: Not Answered   Clinical Intake:  Pre-visit preparation completed: Yes  Pain : No/denies pain     Nutritional Risks: None Diabetes: Yes CBG done?: No Did pt. bring in CBG monitor from home?: No  How often do you need to have someone help you when you read instructions, pamphlets, or other written materials from your doctor or  pharmacy?: 1 - Never What is the last grade level you completed in school?: High School  Diabetic?yes Nutrition Risk Assessment:  Has the patient had any N/V/D within the last 2 months?  No  Does the patient have any non-healing wounds?  No  Has the patient had any unintentional weight loss or weight gain?  No   Diabetes:  Is the patient diabetic?  Yes  If diabetic, was a CBG obtained today?  No  Did the patient bring in their glucometer from home?  No  How often do you monitor your CBG's? 2 xweek .   Financial Strains and Diabetes Management:  Are you having any financial strains with the device, your supplies or your medication? No .  Does the patient want to be  seen by Chronic Care Management for management of their diabetes?  No  Would the patient like to be referred to a Nutritionist or for Diabetic Management?  No    Diabetic Exams:  Diabetic Eye Exam: Completed 09/2021 Diabetic Foot Exam: Overdue, Pt has been advised about the importance in completing this exam. Pt is scheduled for diabetic foot exam on next office visit .   Interpreter Needed?: No  Information entered by :: L.Wilson,LPN   Activities of Daily Living    07/23/2022    9:06 AM 11/18/2021   12:36 PM  In your present state of health, do you have any difficulty performing the following activities:  Hearing? 0 0  Vision? 0 0  Difficulty concentrating or making decisions? 0 0  Walking or climbing stairs? 0 0  Dressing or bathing? 0 0  Doing errands, shopping? 0 0  Preparing Food and eating ? N   Using the Toilet? N   In the past six months, have you accidently leaked urine? N   Do you have problems with loss of bowel control? N   Managing your Medications? N   Managing your Finances? N   Housekeeping or managing your Housekeeping? N     Patient Care Team: Sheliah Hatch, MD as PCP - General Marcelle Overlie, MD as Consulting Physician (Obstetrics and Gynecology) Ernesto Rutherford, MD as  Consulting Physician (Ophthalmology) Elliot Cousin, OD (Optometry) Charna Elizabeth, MD as Consulting Physician (Gastroenterology) Dahlia Byes, [facility] as Pharmacist (Pharmacist)  Indicate any recent Medical Services you may have received from other than [facility] providers in the past year (date may be approximate).     Assessment:   This is a routine wellness examination for [place].  Hearing/Vision screen Vision Screening - Comments:: Annual eye exams wear glasses   Dietary issues and exercise activities discussed: Current Exercise Habits: Home exercise routine, Type of exercise: walking, Time (Minutes): 30, Frequency (Times/Week): 3, Weekly Exercise (Minutes/Week): 90, Intensity: Mild, Exercise limited by: None identified   Goals Addressed   None    Depression Screen    07/23/2022    9:03 AM 07/23/2022    9:02 AM 05/18/2022    8:30 AM 11/18/2021   12:37 PM 05/02/2021   10:30 AM 11/01/2020   10:02 AM 07/29/2020    8:47 AM  PHQ 2/9 Scores  PHQ - 2 Score 0 0 0 0 0 0 0  PHQ- 9 Score   0 0 0 0 0    Fall Risk    07/23/2022    9:05 AM 05/18/2022    8:30 AM 11/18/2021   12:38 PM 05/02/2021   10:30 AM 11/01/2020   10:02 AM  Fall Risk   Falls in the past year? 1 1 0 0 0  Number falls in past yr: 0 0  0 0  Injury with Fall? 0 1  0 0  Risk for fall due to :  History of fall(s) No Fall Risks No Fall Risks No Fall Risks  Follow up Falls evaluation completed;Education provided Falls evaluation completed Falls evaluation completed      FALL RISK PREVENTION PERTAINING TO THE HOME:  Any stairs in or around the home? No  If so, are there any without handrails? No  Home free of loose throw rugs in walkways, pet beds, electrical cords, etc? Yes  Adequate lighting in your home to reduce risk of falls? Yes   ASSISTIVE DEVICES UTILIZED TO PREVENT FALLS:  Life alert? No  Use of a cane, walker  or w/c? No  Grab bars in the bathroom? No  Shower chair or bench in shower? No  Elevated toilet seat or  a handicapped toilet? No   Cognitive Function:    Normal cognitive status assessed by telephone conversation  by this Nurse Health Advisor. No abnormalities found.      07/23/2022    9:02 AM 06/24/2020   10:08 AM  6CIT Screen  What Year? 0 points 0 points  What month? 0 points 0 points  What time? 0 points 0 points  Count back from 20 0 points 0 points  Months in reverse 0 points 0 points  Repeat phrase 0 points 0 points  Total Score 0 points 0 points    Immunizations Immunization History  Administered Date(s) Administered   Fluad Quad(high Dose 65+) 08/08/2019, 11/01/2020, 11/18/2021   Influenza Split 12/23/2012   Influenza,inj,Quad PF,6+ Mos 11/06/2013, 10/08/2014, 09/23/2015, 09/10/2016, 09/17/2017, 07/29/2018   PFIZER SARS-COV-2 Pediatric Vaccination 5-61yrs 10/25/2020   PFIZER(Purple Top)SARS-COV-2 Vaccination 02/26/2020, 03/20/2020, 10/25/2020   PNEUMOCOCCAL CONJUGATE-20 05/18/2022   Pneumococcal Conjugate-13 09/17/2017   Pneumococcal Polysaccharide-23 10/08/2014   Tdap 04/14/2013   Zoster, Live 11/20/2014    TDAP status: Up to date  Flu Vaccine status: Up to date  Pneumococcal vaccine status: Up to date  Covid-19 vaccine status: Completed vaccines  Qualifies for Shingles Vaccine? Yes   Zostavax completed No   Shingrix Completed?: No.    Education has been provided regarding the importance of this vaccine. Patient has been advised to call insurance company to determine out of pocket expense if they have not yet received this vaccine. Advised may also receive vaccine at local pharmacy or Health Dept. Verbalized acceptance and understanding.  Screening Tests Health Maintenance  Topic Date Due   INFLUENZA VACCINE  07/14/2022   OPHTHALMOLOGY EXAM  09/30/2022   MAMMOGRAM  10/13/2022   HEMOGLOBIN A1C  11/17/2022   FOOT EXAM  11/18/2022   DEXA SCAN  04/02/2023   TETANUS/TDAP  04/15/2023   COLONOSCOPY (Pts 45-21yrs Insurance coverage will need to be confirmed)   07/07/2023   Pneumonia Vaccine 53+ Years old  Completed   Hepatitis C Screening  Completed   HPV VACCINES  Aged Out   COVID-19 Vaccine  Discontinued   Zoster Vaccines- Shingrix  Discontinued    Health Maintenance  Health Maintenance Due  Topic Date Due   INFLUENZA VACCINE  07/14/2022    Colorectal cancer screening: Type of screening: Colonoscopy. Completed 07/06/2018. Repeat every 5 years  Mammogram status: Completed 10/13/2021. Repeat every year  Bone Density status: Completed 04/01/2021. Results reflect: Bone density results: OSTEOPOROSIS. Repeat every 2 years.  Lung Cancer Screening: (Low Dose CT Chest recommended if Age 71-80 years, 30 pack-year currently smoking OR have quit w/in 15years.) does not qualify.   Lung Cancer Screening Referral: n/a  Additional Screening:  Hepatitis C Screening: does not qualify;   Vision Screening: Recommended annual ophthalmology exams for early detection of glaucoma and other disorders of the eye. Is the patient up to date with their annual eye exam?  Yes  Who is the provider or what is the name of the office in which the patient attends annual eye exams? Dr.Groat  If pt is not established with a provider, would they like to be referred to a provider to establish care? No .   Dental Screening: Recommended annual dental exams for proper oral hygiene  Community Resource Referral / Chronic Care Management: CRR required this visit?  No   CCM required this  visit?  No      Plan:     I have personally reviewed and noted the following in the patient's chart:   Medical and social history Use of alcohol, tobacco or illicit drugs  Current medications and supplements including opioid prescriptions.  Functional ability and status Nutritional status Physical activity Advanced directives List of other physicians Hospitalizations, surgeries, and ER visits in previous 12 months Vitals Screenings to include cognitive, depression, and  falls Referrals and appointments  In addition, I have reviewed and discussed with patient certain preventive protocols, quality metrics, and best practice recommendations. A written personalized care plan for preventive services as well as general preventive health recommendations were provided to patient.     Lorrene Reid, LPN   5/52/0802   Nurse Notes: none

## 2022-07-23 NOTE — Patient Instructions (Signed)
Priscilla Thompson , Thank you for taking time to come for your Medicare Wellness Visit. I appreciate your ongoing commitment to your health goals. Please review the following plan we discussed and let me know if I can assist you in the future.   Screening recommendations/referrals: Colonoscopy: 07/06/2018 Mammogram: 10/13/2021 Bone Density: 04/01/2021 Recommended yearly ophthalmology/optometry visit for glaucoma screening and checkup Recommended yearly dental visit for hygiene and checkup  Vaccinations: Influenza vaccine: completed  Pneumococcal vaccine: completed  Tdap vaccine: 04/14/2013 Shingles vaccine: will consider     Advanced directives: none   Conditions/risks identified: none   Next appointment: none    Preventive Care 65 Years and Older, Female Preventive care refers to lifestyle choices and visits with your health care provider that can promote health and wellness. What does preventive care include? A yearly physical exam. This is also called an annual well check. Dental exams once or twice a year. Routine eye exams. Ask your health care provider how often you should have your eyes checked. Personal lifestyle choices, including: Daily care of your teeth and gums. Regular physical activity. Eating a healthy diet. Avoiding tobacco and drug use. Limiting alcohol use. Practicing safe sex. Taking low-dose aspirin every day. Taking vitamin and mineral supplements as recommended by your health care provider. What happens during an annual well check? The services and screenings done by your health care provider during your annual well check will depend on your age, overall health, lifestyle risk factors, and family history of disease. Counseling  Your health care provider may ask you questions about your: Alcohol use. Tobacco use. Drug use. Emotional well-being. Home and relationship well-being. Sexual activity. Eating habits. History of falls. Memory and ability to  understand (cognition). Work and work Astronomer. Reproductive health. Screening  You may have the following tests or measurements: Height, weight, and BMI. Blood pressure. Lipid and cholesterol levels. These may be checked every 5 years, or more frequently if you are over 15 years old. Skin check. Lung cancer screening. You may have this screening every year starting at age 32 if you have a 30-pack-year history of smoking and currently smoke or have quit within the past 15 years. Fecal occult blood test (FOBT) of the stool. You may have this test every year starting at age 68. Flexible sigmoidoscopy or colonoscopy. You may have a sigmoidoscopy every 5 years or a colonoscopy every 10 years starting at age 3. Hepatitis C blood test. Hepatitis B blood test. Sexually transmitted disease (STD) testing. Diabetes screening. This is done by checking your blood sugar (glucose) after you have not eaten for a while (fasting). You may have this done every 1-3 years. Bone density scan. This is done to screen for osteoporosis. You may have this done starting at age 27. Mammogram. This may be done every 1-2 years. Talk to your health care provider about how often you should have regular mammograms. Talk with your health care provider about your test results, treatment options, and if necessary, the need for more tests. Vaccines  Your health care provider may recommend certain vaccines, such as: Influenza vaccine. This is recommended every year. Tetanus, diphtheria, and acellular pertussis (Tdap, Td) vaccine. You may need a Td booster every 10 years. Zoster vaccine. You may need this after age 106. Pneumococcal 13-valent conjugate (PCV13) vaccine. One dose is recommended after age 31. Pneumococcal polysaccharide (PPSV23) vaccine. One dose is recommended after age 83. Talk to your health care provider about which screenings and vaccines you need and how often  you need them. This information is not  intended to replace advice given to you by your health care provider. Make sure you discuss any questions you have with your health care provider. Document Released: 12/27/2015 Document Revised: 08/19/2016 Document Reviewed: 10/01/2015 Elsevier Interactive Patient Education  2017 [place] Prevention in the Home Falls can cause injuries. They can happen to people of all ages. There are many things you can do to make your home safe and to help prevent falls. What can I do on the outside of my home? Regularly fix the edges of walkways and driveways and fix any cracks. Remove anything that might make you trip as you walk through a door, such as a raised step or threshold. Trim any bushes or trees on the path to your home. Use bright outdoor lighting. Clear any walking paths of anything that might make someone trip, such as rocks or tools. Regularly check to see if handrails are loose or broken. Make sure that both sides of any steps have handrails. Any raised decks and porches should have guardrails on the edges. Have any leaves, snow, or ice cleared regularly. Use sand or salt on walking paths during winter. Clean up any spills in your garage right away. This includes oil or grease spills. What can I do in the bathroom? Use night lights. Install grab bars by the toilet and in the tub and shower. Do not use towel bars as grab bars. Use non-skid mats or decals in the tub or shower. If you need to sit down in the shower, use a plastic, non-slip stool. Keep the floor dry. Clean up any water that spills on the floor as soon as it happens. Remove soap buildup in the tub or shower regularly. Attach bath mats securely with double-sided non-slip rug tape. Do not have throw rugs and other things on the floor that can make you trip. What can I do in the bedroom? Use night lights. Make sure that you have a light by your bed that is easy to reach. Do not use any sheets or blankets that are  too big for your bed. They should not hang down onto the floor. Have a firm chair that has side arms. You can use this for support while you get dressed. Do not have throw rugs and other things on the floor that can make you trip. What can I do in the kitchen? Clean up any spills right away. Avoid walking on wet floors. Keep items that you use a lot in easy-to-reach places. If you need to reach something above you, use a strong step stool that has a grab bar. Keep electrical cords out of the way. Do not use floor polish or wax that makes floors slippery. If you must use wax, use non-skid floor wax. Do not have throw rugs and other things on the floor that can make you trip. What can I do with my stairs? Do not leave any items on the stairs. Make sure that there are handrails on both sides of the stairs and use them. Fix handrails that are broken or loose. Make sure that handrails are as long as the stairways. Check any carpeting to make sure that it is firmly attached to the stairs. Fix any carpet that is loose or worn. Avoid having throw rugs at the top or bottom of the stairs. If you do have throw rugs, attach them to the floor with carpet tape. Make sure that you have a light  switch at the top of the stairs and the bottom of the stairs. If you do not have them, ask someone to add them for you. What else can I do to help prevent falls? Wear shoes that: Do not have high heels. Have rubber bottoms. Are comfortable and fit you well. Are closed at the toe. Do not wear sandals. If you use a stepladder: Make sure that it is fully opened. Do not climb a closed stepladder. Make sure that both sides of the stepladder are locked into place. Ask someone to hold it for you, if possible. Clearly mark and make sure that you can see: Any grab bars or handrails. First and last steps. Where the edge of each step is. Use tools that help you move around (mobility aids) if they are needed. These  include: Canes. Walkers. Scooters. Crutches. Turn on the lights when you go into a dark area. Replace any light bulbs as soon as they burn out. Set up your furniture so you have a clear path. Avoid moving your furniture around. If any of your floors are uneven, fix them. If there are any pets around you, be aware of where they are. Review your medicines with your doctor. Some medicines can make you feel dizzy. This can increase your chance of falling. Ask your doctor what other things that you can do to help prevent falls. This information is not intended to replace advice given to you by your health care provider. Make sure you discuss any questions you have with your health care provider. Document Released: 09/26/2009 Document Revised: 05/07/2016 Document Reviewed: 01/04/2015 Elsevier Interactive Patient Education  2017 [employer].

## 2022-08-12 ENCOUNTER — Other Ambulatory Visit: Payer: Self-pay | Admitting: Family Medicine

## 2022-08-12 DIAGNOSIS — I10 Essential (primary) hypertension: Secondary | ICD-10-CM

## 2022-08-31 ENCOUNTER — Ambulatory Visit (INDEPENDENT_AMBULATORY_CARE_PROVIDER_SITE_OTHER): Payer: Medicare Other | Admitting: Family Medicine

## 2022-08-31 DIAGNOSIS — Z23 Encounter for immunization: Secondary | ICD-10-CM

## 2022-08-31 NOTE — Progress Notes (Signed)
Pt received her flu vaccine today tolerated well  

## 2022-09-26 ENCOUNTER — Other Ambulatory Visit: Payer: Self-pay | Admitting: Family Medicine

## 2022-09-27 ENCOUNTER — Other Ambulatory Visit: Payer: Self-pay | Admitting: Family Medicine

## 2022-10-07 DIAGNOSIS — E119 Type 2 diabetes mellitus without complications: Secondary | ICD-10-CM | POA: Diagnosis not present

## 2022-10-07 DIAGNOSIS — H2513 Age-related nuclear cataract, bilateral: Secondary | ICD-10-CM | POA: Diagnosis not present

## 2022-10-07 DIAGNOSIS — H04123 Dry eye syndrome of bilateral lacrimal glands: Secondary | ICD-10-CM | POA: Diagnosis not present

## 2022-10-07 DIAGNOSIS — H43812 Vitreous degeneration, left eye: Secondary | ICD-10-CM | POA: Diagnosis not present

## 2022-10-07 DIAGNOSIS — H40023 Open angle with borderline findings, high risk, bilateral: Secondary | ICD-10-CM | POA: Diagnosis not present

## 2022-10-07 DIAGNOSIS — H31101 Choroidal degeneration, unspecified, right eye: Secondary | ICD-10-CM | POA: Diagnosis not present

## 2022-10-07 LAB — HM DIABETES EYE EXAM

## 2022-10-08 ENCOUNTER — Encounter: Payer: Self-pay | Admitting: Family Medicine

## 2022-10-14 NOTE — Progress Notes (Signed)
Chronic Care Management Pharmacy Note  10/22/2022 Name:  Priscilla Thompson MRN:  [id number] DOB:  [birth year]  Summary: Patient doing well overall, no changes needed at this time.   FU 1 year  Subjective: HAYZEL RUBERG is an 71 y.o. year old female who is a primary patient of Tabori, Aundra Millet, MD.  The CCM team was consulted for assistance with disease management and care coordination needs.    Engaged with patient by telephone for follow up visit in response to provider referral for pharmacy case management and/or care coordination services.   Consent to Services:  The patient was given information about Chronic Care Management services, agreed to services, and gave verbal consent prior to initiation of services.  Please see initial visit note for detailed documentation.   Patient Care Team: Midge Minium, MD as PCP - Ventura Bruns, MD as Consulting Physician (Obstetrics and Gynecology) Clent Jacks, MD as Consulting Physician (Ophthalmology) Dyke Maes, [place] (Optometry) Juanita Craver, MD as Consulting Physician (Gastroenterology) Edythe Clarity, [facility] (Pharmacist)  Hospital visits: None in previous 6 months  Objective:  Lab Results  Component Value Date   CREATININE 0.92 05/18/2022   CREATININE 0.85 11/18/2021   CREATININE 0.82 05/02/2021    Lab Results  Component Value Date   HGBA1C 6.3 05/18/2022   HGBA1C 6.4 11/18/2021   HGBA1C 6.3 05/02/2021   Last diabetic Eye exam:  Lab Results  Component Value Date/Time   HMDIABEYEEXA No Retinopathy 10/07/2022 12:00 AM    Last diabetic Foot exam: No results found for: "HMDIABFOOTEX"      Component Value Date/Time   CHOL 134 05/18/2022 0859   CHOL 122 11/18/2021 1326   CHOL 141 05/02/2021 1118   TRIG 64.0 05/18/2022 0859   TRIG 59.0 11/18/2021 1326   TRIG 59.0 05/02/2021 1118   HDL 60.30 05/18/2022 0859   HDL 62.20 11/18/2021 1326   HDL 65.70 05/02/2021 1118   CHOLHDL 2 05/18/2022  0859   VLDL 12.8 05/18/2022 0859   LDLCALC 61 05/18/2022 0859   LDLCALC 48 11/18/2021 1326   LDLCALC 64 05/02/2021 1118   LDLDIRECT 131.4 08/18/2011 0832       Latest Ref Rng & Units 05/18/2022    8:59 AM 11/18/2021    1:26 PM 05/02/2021   11:18 AM  Hepatic Function  Total Protein 6.0 - 8.3 g/dL 7.5  6.9  7.1   Albumin 3.5 - 5.2 g/dL 4.2  4.2  4.3   AST 0 - 37 U/L _0 ALT 0 - 35 U/L _1 Alk Phosphatase 39 - 117 U/L 54  55  55   Total Bilirubin 0.2 - 1.2 mg/dL 0.7  0.8  0.6   Bilirubin, Direct 0.0 - 0.3 mg/dL 0.1  0.2  0.1     Lab Results  Component Value Date/Time   TSH 1.41 05/18/2022 08:59 AM   TSH 1.10 11/18/2021 01:26 PM   FREET4 1.05 01/13/2011 12:00 AM       Latest Ref Rng & Units 05/18/2022    8:59 AM 11/18/2021    1:26 PM 05/02/2021   11:18 AM  CBC  WBC 4.0 - 10.5 K/uL 6.0  6.4  6.6   Hemoglobin 12.0 - 15.0 g/dL 13.1  13.3  13.2   Hematocrit 36.0 - 46.0 % 39.9  40.6  39.9   Platelets 150.0 - 400.0 K/uL 234.0  237.0  224.0     Lab Results  Component Value Date/Time   VD25OH 69.60 11/18/2021 01:26 PM   VD25OH 57.93 11/01/2020 10:51 AM    Clinical ASCVD:  The 10-year ASCVD risk score (Arnett DK, et al., 2019) is: 26%   Values used to calculate the score:     Age: 57 years     Sex: Female     Is Non-Hispanic African American: Yes     Diabetic: Yes     Tobacco smoker: No     Systolic Blood Pressure: 027 mmHg     Is BP treated: Yes     HDL Cholesterol: 60.3 mg/dL     Total Cholesterol: 134 mg/dL   Social History   Tobacco Use  Smoking Status Never  Smokeless Tobacco Never   BP Readings from Last 3 Encounters:  05/18/22 130/80  11/18/21 128/80  05/02/21 122/80   Pulse Readings from Last 3 Encounters:  05/18/22 77  11/18/21 84  05/02/21 82   Wt Readings from Last 3 Encounters:  05/18/22 206 lb (93.4 kg)  11/18/21 206 lb 9.6 oz (93.7 kg)  05/02/21 201 lb 9.6 oz (91.4 kg)   BMI Readings from Last 3 Encounters:  05/18/22 37.68  kg/m  11/18/21 37.79 kg/m  05/02/21 36.87 kg/m    Assessment: Review of patient past medical history, allergies, medications, health status, including review of consultants reports, laboratory and other test data, was performed as part of comprehensive evaluation and provision of chronic care management services.   SDOH:  (Social Determinants of Health) assessments and interventions performed: No, done within last year Financial Resource Strain: Low Risk  (07/23/2022)   Overall Financial Resource Strain (CARDIA)    Difficulty of Paying Living Expenses: Not hard at all   Food Insecurity: No Food Insecurity (07/23/2022)   Hunger Vital Sign    Worried About Running Out of Food in the Last Year: Never true    Ran Out of Food in the Last Year: Never true    SDOH Interventions    Flowsheet Row Clinical Support from 07/23/2022 in [place] [facility] [place] Management from 03/26/2020 in [place] [facility] [place] Interventions Intervention Not Indicated Other (Comment)  Housing Interventions Intervention Not Indicated --  Transportation Interventions Intervention Not Indicated Other (Comment)  Financial Strain Interventions Intervention Not Indicated --  Physical Activity Interventions Intervention Not Indicated --  Stress Interventions Intervention Not Indicated --  Social Connections Interventions Intervention Not Indicated --       CCM Care Plan  No Known Allergies  Medications Reviewed Today     Reviewed by Edythe Clarity, [place] (Pharmacist) on 10/21/22 at 1350  Med List Status: <None>   Medication Order Taking? Sig Documenting Provider Last Dose Status Informant  acetaminophen (TYLENOL) 650 MG CR tablet [id number] Yes Take 1,300 mg by mouth every 8 (eight) hours as needed for pain (for arthritis). [provider] Taking Active   amLODipine (NORVASC) 5 MG tablet  [id number] Yes TAKE 1 TABLET BY MOUTH EVERY DAY Midge Minium, MD Taking Active   aspirin 81 MG tablet [id number] Yes Take 81 mg by mouth daily. [provider] Taking Active   atorvastatin (LIPITOR) 20 MG tablet [id number] Yes TAKE 1 TABLET BY MOUTH EVERY DAY Midge Minium, MD Taking Active   Calcium Carb-Cholecalciferol (CALCIUM-VITAMIN D) 500-400 MG-UNIT TABS [id number] Yes Take 1 tablet by mouth daily. [provider] Taking Active   Cholecalciferol (VITAMIN D3) 1000 UNITS CAPS [id number] Yes Take  1 capsule by mouth daily. [provider] Taking Active   dorzolamide-timolol (COSOPT) 22.3-6.8 MG/ML ophthalmic solution [id number] Yes Place 1 drop into both eyes 2 (two) times daily.  [provider] Taking Active   fish oil-omega-3 fatty acids 1000 MG capsule [id number] Yes Take 1,000 mg by mouth daily.  [provider] Taking Active   KLOR-CON M20 20 MEQ tablet [id number] Yes TAKE 1 TABLET BY MOUTH EVERY DAY Midge Minium, MD Taking Active   latanoprost (XALATAN) 0.005 % ophthalmic solution [id number] Yes Place 1 drop into both eyes at bedtime.  [provider] Taking Active   levothyroxine (SYNTHROID) 50 MCG tablet [id number] Yes TAKE 1 TABLET BY MOUTH EVERY DAY Midge Minium, MD Taking Active   lisinopril-hydrochlorothiazide (ZESTORETIC) 20-25 MG tablet [id number] Yes TAKE 1 TABLET BY MOUTH EVERY DAY Midge Minium, MD Taking Active   loratadine (CLARITIN) 10 MG tablet [id number] Yes Take 10 mg by mouth daily. [provider] Taking Active   Multiple Vitamins-Minerals (MULTI FOR HER 50+ PO) [id number] Yes Take 2 chew by mouth daily [provider] Taking Active   omeprazole (PRILOSEC) 20 MG capsule [id number] Yes Take 20 mg by mouth 3 (three) times a week.  [provider] Taking Active             Patient Active Problem List   Diagnosis Date Noted   Gastroesophageal reflux disease 11/18/2021    Personal history of colonic polyps 11/18/2021   Hyperlipidemia associated with type 2 diabetes mellitus ([place]) 07/17/2019   Morbid obesity ([place]) 06/23/2018   Grief 01/12/2013   Rhinitis 01/27/2012   General medical examination 08/18/2011   HAND PAIN, BILATERAL 02/16/2011   ANKLE EDEMA 02/16/2011   Diet-controlled diabetes mellitus ([place]) 02/02/2011   BACK PAIN 02/01/2009   Hypothyroidism 01/25/2009   Vitamin D deficiency 01/25/2009   GLAUCOMA NOS 01/25/2009   Essential hypertension 01/25/2009    Immunization History  Administered Date(s) Administered   Fluad Quad(high Dose 65+) 08/08/2019, 11/01/2020, 11/18/2021   Influenza Split 12/23/2012   Influenza, High Dose Seasonal PF 08/31/2022   Influenza,inj,Quad PF,6+ Mos 11/06/2013, 10/08/2014, 09/23/2015, 09/10/2016, 09/17/2017, 07/29/2018   PFIZER SARS-COV-2 Pediatric Vaccination 5-43yr 10/25/2020   PFIZER(Purple Top)SARS-COV-2 Vaccination 02/26/2020, 03/20/2020, 10/25/2020   PNEUMOCOCCAL CONJUGATE-20 05/18/2022   Pneumococcal Conjugate-13 09/17/2017   Pneumococcal Polysaccharide-23 10/08/2014   Tdap 04/14/2013   Zoster, Live 11/20/2014    Conditions to be addressed/monitored: HLD HTN DMII  Care Plan : CCM Pharmacy Care Plam  Updates made by DEdythe Clarity RPH since 10/22/2022 12:00 AM     Problem: Diet controlled DM, hypothyroidism, HLD, ankle edema   Priority: High     Long-Range Goal: Disease Management   Start Date: 04/18/2021  Expected End Date: 04/18/2022  Recent Progress: On track  Priority: High  Note:   Current Barriers:  None today  Pharmacist Clinical Goal(s):  Patient will verbalize ability to afford treatment regimen achieve adherence to monitoring guidelines and medication adherence to achieve therapeutic efficacy through collaboration with PharmD and provider.   Interventions: 1:1 collaboration with TMidge Minium MD regarding development and update of comprehensive plan of care as evidenced by  provider attestation and co-signature Inter-disciplinary care team collaboration (see longitudinal plan of care) Comprehensive medication review performed; medication list updated in electronic medical record  Hypertension (BP goal <130/80) 10/21/22 -Controlled, based on home monitoring and office BP -Current treatment: Amlodipine 5 mg once daily Appropriate, Effective, Safe, Accessible Lisinopril-HCTZ 20-25 mg once daily  Appropriate, Effective,  Safe, Accessible -Current home readings: 129/80 the last time she checked at home -Denies hypotensive/hypertensive symptoms.  -Reviewed for side effects - no problems noted.  -Educated on BP goals and benefits of medications for prevention of heart attack, stroke and kidney damage; -Recommended to continue current medication -No changes needed at this time, focus on exercise  Hyperlipidemia: (LDL goal < 100) 10/21/22 -Controlled, most recent LDL at goal -Current treatment: Atorvastatin 20 mg once daily before bed  Appropriate, Effective, Safe, Accessible -Side effect review - no problems noted -Educated on Cholesterol goals;  -Recommended to continue current medication -Tolerating well, continue current meds - no changes needed at this time.  Diabetes (A1c goal <6.5%) -Controlled, not assessed -Current medications: N/a - diet only  --Current meal patterns: vegetables, salad, fruit. Trying to drink a lot of water.  -Counseled on low carb diet -Educated on A1c and blood sugar goals; -Exercise: some walking outdoors and indoors at the store -Counseled to check feet daily and get yearly eye exams -Recommended to continue current medication   Follow Up:  Patient agrees to Care Plan and Follow-up. Plan: HC 6 month BP. Pharmacist 54yrf/u        Patient Goals/Self-Care Activities Patient will:  - take medications as prescribed as evidenced by patient report and record review  Medication Assistance: None required.  Patient affirms  current coverage meets needs.  Patient's preferred pharmacy is:  [id number] [place]: [phone]: [phone] Compliance/Adherence/Medication fill history: Care Gaps: Mammogram - patient reports she completed monday  Star-Rating Drugs: Atorvastatin 208m/9/23 90ds Lisinopril/HCTZ 20-72m44m/16/23 90ds  Future Appointments  Date Time Provider [place]/11/2022 10:00 AM TaboMidge Minium [facility]22/2024  8:15 AM [facility] [place] Clinical Pharmacist  [phone]

## 2022-10-19 DIAGNOSIS — Z1231 Encounter for screening mammogram for malignant neoplasm of breast: Secondary | ICD-10-CM | POA: Diagnosis not present

## 2022-10-19 LAB — HM MAMMOGRAPHY

## 2022-10-21 ENCOUNTER — Ambulatory Visit (INDEPENDENT_AMBULATORY_CARE_PROVIDER_SITE_OTHER): Payer: Medicare Other | Admitting: Pharmacist

## 2022-10-21 DIAGNOSIS — E1169 Type 2 diabetes mellitus with other specified complication: Secondary | ICD-10-CM

## 2022-10-21 DIAGNOSIS — I10 Essential (primary) hypertension: Secondary | ICD-10-CM

## 2022-10-21 NOTE — Patient Instructions (Signed)
Visit Information   Goals Addressed   None    Patient Care Plan: CCM Pharmacy Care Plam     Problem Identified: Diet controlled DM, hypothyroidism, HLD, ankle edema   Priority: High     Long-Range Goal: Disease Management   Start Date: 04/18/2021  Expected End Date: 04/18/2022  Recent Progress: On track  Priority: High  Note:   Current Barriers:  None today  Pharmacist Clinical Goal(s):  Patient will verbalize ability to afford treatment regimen achieve adherence to monitoring guidelines and medication adherence to achieve therapeutic efficacy through collaboration with PharmD and provider.   Interventions: 1:1 collaboration with Sheliah Hatch, MD regarding development and update of comprehensive plan of care as evidenced by provider attestation and co-signature Inter-disciplinary care team collaboration (see longitudinal plan of care) Comprehensive medication review performed; medication list updated in electronic medical record  Hypertension (BP goal <130/80) 10/21/22 -Controlled, based on home monitoring and office BP -Current treatment: Amlodipine 5 mg once daily Appropriate, Effective, Safe, Accessible Lisinopril-HCTZ 20-25 mg once daily  Appropriate, Effective, Safe, Accessible -Current home readings: 129/80 the last time she checked at home -Denies hypotensive/hypertensive symptoms.  -Reviewed for side effects - no problems noted.  -Educated on BP goals and benefits of medications for prevention of heart attack, stroke and kidney damage; -Recommended to continue current medication -No changes needed at this time, focus on exercise  Hyperlipidemia: (LDL goal < 100) 10/21/22 -Controlled, most recent LDL at goal -Current treatment: Atorvastatin 20 mg once daily before bed  Appropriate, Effective, Safe, Accessible -Side effect review - no problems noted -Educated on Cholesterol goals;  -Recommended to continue current medication -Tolerating well, continue current  meds - no changes needed at this time.  Diabetes (A1c goal <6.5%) -Controlled, not assessed -Current medications: N/a - diet only  --Current meal patterns: vegetables, salad, fruit. Trying to drink a lot of water.  -Counseled on low carb diet -Educated on A1c and blood sugar goals; -Exercise: some walking outdoors and indoors at the store -Counseled to check feet daily and get yearly eye exams -Recommended to continue current medication   Follow Up:  Patient agrees to Care Plan and Follow-up. Plan: HC 6 month BP. Pharmacist 32yr f/u         The patient verbalized understanding of instructions, educational materials, and care plan provided today and DECLINED offer to receive copy of patient instructions, educational materials, and care plan.  Telephone follow up appointment with pharmacy team member scheduled for: 1 year  Erroll Luna, [facility]  Willa Frater, PharmD Clinical Pharmacist  [facility] [phone]

## 2022-11-06 ENCOUNTER — Other Ambulatory Visit: Payer: Self-pay | Admitting: Family Medicine

## 2022-11-06 DIAGNOSIS — I10 Essential (primary) hypertension: Secondary | ICD-10-CM

## 2022-11-08 ENCOUNTER — Other Ambulatory Visit: Payer: Self-pay | Admitting: Family Medicine

## 2022-11-08 DIAGNOSIS — E038 Other specified hypothyroidism: Secondary | ICD-10-CM

## 2022-11-12 DIAGNOSIS — E1169 Type 2 diabetes mellitus with other specified complication: Secondary | ICD-10-CM

## 2022-11-12 DIAGNOSIS — I10 Essential (primary) hypertension: Secondary | ICD-10-CM

## 2022-11-12 DIAGNOSIS — E785 Hyperlipidemia, unspecified: Secondary | ICD-10-CM

## 2022-11-17 ENCOUNTER — Other Ambulatory Visit: Payer: Self-pay | Admitting: Lab

## 2022-11-17 ENCOUNTER — Other Ambulatory Visit: Payer: Self-pay | Admitting: Family Medicine

## 2022-11-17 DIAGNOSIS — E1169 Type 2 diabetes mellitus with other specified complication: Secondary | ICD-10-CM

## 2022-11-17 MED ORDER — ATORVASTATIN CALCIUM 20 MG PO TABS: 20.0000 mg | ORAL_TABLET | Freq: Every day | ORAL | 1 refills | Status: DC

## 2022-11-24 ENCOUNTER — Ambulatory Visit (INDEPENDENT_AMBULATORY_CARE_PROVIDER_SITE_OTHER): Payer: Medicare Other | Admitting: Family Medicine

## 2022-11-24 ENCOUNTER — Encounter: Payer: Self-pay | Admitting: Family Medicine

## 2022-11-24 VITALS — BP 128/76 | HR 74 | Temp 98.9°F | Resp 18 | Ht 62.0 in | Wt 201.0 lb

## 2022-11-24 DIAGNOSIS — E119 Type 2 diabetes mellitus without complications: Secondary | ICD-10-CM

## 2022-11-24 DIAGNOSIS — E559 Vitamin D deficiency, unspecified: Secondary | ICD-10-CM

## 2022-11-24 DIAGNOSIS — Z0000 Encounter for general adult medical examination without abnormal findings: Secondary | ICD-10-CM

## 2022-11-24 LAB — LIPID PANEL
Cholesterol: 131 mg/dL (ref 0–200)
HDL: 60.7 mg/dL (ref >39.00)
LDL Cholesterol: 59 mg/dL (ref 0–99)
NonHDL: 70.16
Total CHOL/HDL Ratio: 2
Triglycerides: 56 mg/dL (ref 0.0–149.0)
VLDL: 11.2 mg/dL (ref 0.0–40.0)

## 2022-11-24 LAB — HEPATIC FUNCTION PANEL
ALT: 21 U/L (ref 0–35)
AST: 18 U/L (ref 0–37)
Albumin: 4.4 g/dL (ref 3.5–5.2)
Alkaline Phosphatase: 55 U/L (ref 39–117)
Bilirubin, Direct: 0.1 mg/dL (ref 0.0–0.3)
Total Bilirubin: 0.7 mg/dL (ref 0.2–1.2)
Total Protein: 7.5 g/dL (ref 6.0–8.3)

## 2022-11-24 LAB — BASIC METABOLIC PANEL
BUN: 12 mg/dL (ref 6–23)
CO2: 29 mEq/L (ref 19–32)
Calcium: 9.7 mg/dL (ref 8.4–10.5)
Chloride: 103 mEq/L (ref 96–112)
Creatinine, Ser: 0.81 mg/dL (ref 0.40–1.20)
GFR: 72.62 mL/min (ref >60.00)
Glucose, Bld: 86 mg/dL (ref 70–99)
Potassium: 3.6 mEq/L (ref 3.5–5.1)
Sodium: 141 mEq/L (ref 135–145)

## 2022-11-24 LAB — VITAMIN D 25 HYDROXY (VIT D DEFICIENCY, FRACTURES): VITD: 94.13 ng/mL (ref 30.00–100.00)

## 2022-11-24 LAB — CBC WITH DIFFERENTIAL/PLATELET
Basophils Absolute: 0 10*3/uL (ref 0.0–0.1)
Basophils Relative: 0.7 % (ref 0.0–3.0)
Eosinophils Absolute: 0.1 10*3/uL (ref 0.0–0.7)
Eosinophils Relative: 1.3 % (ref 0.0–5.0)
HCT: 39.4 % (ref 36.0–46.0)
Hemoglobin: 13 g/dL (ref 12.0–15.0)
Lymphocytes Relative: 36.6 % (ref 12.0–46.0)
Lymphs Abs: 2.7 10*3/uL (ref 0.7–4.0)
MCHC: 32.9 g/dL (ref 30.0–36.0)
MCV: 87.8 fl (ref 78.0–100.0)
Monocytes Absolute: 0.6 10*3/uL (ref 0.1–1.0)
Monocytes Relative: 8.3 % (ref 3.0–12.0)
Neutro Abs: 3.9 10*3/uL (ref 1.4–7.7)
Neutrophils Relative %: 53.1 % (ref 43.0–77.0)
Platelets: 284 10*3/uL (ref 150.0–400.0)
RBC: 4.48 Mil/uL (ref 3.87–5.11)
RDW: 13.6 % (ref 11.5–15.5)
WBC: 7.4 10*3/uL (ref 4.0–10.5)

## 2022-11-24 LAB — TSH: TSH: 0.98 u[IU]/mL (ref 0.35–5.50)

## 2022-11-24 LAB — HEMOGLOBIN A1C: Hgb A1c MFr Bld: 6.6 % — ABNORMAL HIGH (ref 4.6–6.5)

## 2022-11-24 NOTE — Assessment & Plan Note (Signed)
Pt's PE WNL w/ exception of BMI.  ITD on mammo, colonoscopy, DEXA, immunizations.  Check labs.  Anticipatory guidance provided.

## 2022-11-24 NOTE — Patient Instructions (Addendum)
Follow up in 6 months to recheck sugar, BP, and cholesterol We'll notify you of your lab results and make any changes if needed Continue to work on low carb, low sugar, healthy diet- you're doing great! Try and get regular physical activity Call with any questions or concerns Stay Safe!  Stay Healthy! Happy Holidays!!!

## 2022-11-24 NOTE — Assessment & Plan Note (Signed)
Check labs and replete prn. 

## 2022-11-24 NOTE — Assessment & Plan Note (Signed)
Chronic problem.  Pt has hx of good control w/ lifestyle modifications.  Is down 5 lbs today.  UTD on eye exam, microalbumin.  Foot exam done.  Check labs.  Adjust tx plan prn.

## 2022-11-24 NOTE — Progress Notes (Signed)
Subjective:    Patient ID: Priscilla Thompson, female    DOB: 11/02/1951, 71 y.o.   MRN: [id number]  HPI CPE- UTD on mammo, colonoscopy, DEXA, PNA, flu.  Due for foot exam.  Patient Care Team    Relationship Specialty Notifications Start End  Midge Minium, MD PCP - General   11/26/10   Dian Queen, MD Consulting Physician Obstetrics and Gynecology  06/14/15   Clent Jacks, MD Consulting Physician Ophthalmology  06/14/15   Dyke Maes, OD  Optometry  06/14/15   Juanita Craver, MD Consulting Physician Gastroenterology  06/14/15   Edythe Clarity, [facility]  Pharmacist  10/21/22    Comment: [phone]    Health Maintenance  Topic Date Due   HEMOGLOBIN A1C  11/17/2022   FOOT EXAM  11/18/2022   DEXA SCAN  04/02/2023   DTaP/Tdap/Td (2 - Td or Tdap) 04/15/2023   Diabetic kidney evaluation - eGFR measurement  05/19/2023   Diabetic kidney evaluation - Urine ACR  05/19/2023   COLONOSCOPY (Pts 45-14yr Insurance coverage will need to be confirmed)  07/07/2023   Medicare Annual Wellness (AWV)  07/24/2023   MAMMOGRAM  08/31/2023   OPHTHALMOLOGY EXAM  10/08/2023   Pneumonia Vaccine 71 Years old  Completed   INFLUENZA VACCINE  Completed   Hepatitis C Screening  Completed   HPV VACCINES  Aged Out   COVID-19 Vaccine  Discontinued   Zoster Vaccines- Shingrix  Discontinued      Review of Systems Patient reports no vision/ hearing changes, adenopathy,fever, persistant/recurrent hoarseness , swallowing issues, chest pain, palpitations, edema, persistant/recurrent cough, hemoptysis, dyspnea (rest/exertional/paroxysmal nocturnal), gastrointestinal bleeding (melena, rectal bleeding), abdominal pain, significant heartburn, bowel changes, GU symptoms (dysuria, hematuria, incontinence), Gyn symptoms (abnormal  bleeding, pain),  syncope, focal weakness, memory loss, numbness & tingling, skin/hair/nail changes, abnormal bruising or bleeding, anxiety, or depression.   + 5 lb weight loss     Objective:   Physical Exam General Appearance:    Alert, cooperative, no distress, appears stated age  Head:    Normocephalic, without obvious abnormality, atraumatic  Eyes:    PERRL, conjunctiva/corneas clear, EOM's intact both eyes  Ears:    Normal TM's and external ear canals, both ears  Nose:   Nares normal, septum midline, mucosa normal, no drainage    or sinus tenderness  Throat:   Lips, mucosa, and tongue normal; teeth and gums normal  Neck:   Supple, symmetrical, trachea midline, no adenopathy;    Thyroid: no enlargement/tenderness/nodules  Back:     Symmetric, no curvature, ROM normal, no CVA tenderness  Lungs:     Clear to auscultation bilaterally, respirations unlabored  Chest Wall:    No tenderness or deformity   Heart:    Regular rate and rhythm, S1 and S2 normal, no murmur, rub   or gallop  Breast Exam:    Deferred to GYN  Abdomen:     Soft, non-tender, bowel sounds active all four quadrants,    no masses, no organomegaly  Genitalia:    Deferred to GYN  Rectal:    Extremities:   Extremities normal, atraumatic, no cyanosis or edema  Pulses:   2+ and symmetric all extremities  Skin:   Skin color, texture, turgor normal, no rashes or lesions  Lymph nodes:   Cervical, supraclavicular, and axillary nodes normal  Neurologic:   CNII-XII intact, normal strength, sensation and reflexes    throughout          Assessment & Plan:

## 2022-11-25 ENCOUNTER — Telehealth: Payer: Self-pay

## 2022-11-25 NOTE — Telephone Encounter (Signed)
Left lab results on pt VM 

## 2022-11-25 NOTE — Telephone Encounter (Signed)
-----   Message from Sheliah Hatch, MD sent at 11/25/2022  7:45 AM EST ----- Labs look great!  No changes at this time

## 2022-11-26 ENCOUNTER — Encounter: Payer: Self-pay | Admitting: Family Medicine

## 2023-01-31 ENCOUNTER — Other Ambulatory Visit: Payer: Self-pay | Admitting: Family Medicine

## 2023-01-31 DIAGNOSIS — I10 Essential (primary) hypertension: Secondary | ICD-10-CM

## 2023-03-12 ENCOUNTER — Other Ambulatory Visit: Payer: Self-pay | Admitting: Family Medicine

## 2023-03-23 ENCOUNTER — Other Ambulatory Visit: Payer: Self-pay | Admitting: Family Medicine

## 2023-04-05 DIAGNOSIS — R29890 Loss of height: Secondary | ICD-10-CM | POA: Diagnosis not present

## 2023-04-05 LAB — HM DEXA SCAN: HM Dexa Scan: NORMAL

## 2023-04-13 DIAGNOSIS — Z86010 Personal history of colonic polyps: Secondary | ICD-10-CM | POA: Diagnosis not present

## 2023-04-13 DIAGNOSIS — K219 Gastro-esophageal reflux disease without esophagitis: Secondary | ICD-10-CM | POA: Diagnosis not present

## 2023-04-13 DIAGNOSIS — K5730 Diverticulosis of large intestine without perforation or abscess without bleeding: Secondary | ICD-10-CM | POA: Diagnosis not present

## 2023-04-13 DIAGNOSIS — Z1211 Encounter for screening for malignant neoplasm of colon: Secondary | ICD-10-CM | POA: Diagnosis not present

## 2023-04-28 ENCOUNTER — Other Ambulatory Visit: Payer: Self-pay | Admitting: Family Medicine

## 2023-04-28 DIAGNOSIS — I10 Essential (primary) hypertension: Secondary | ICD-10-CM

## 2023-05-03 ENCOUNTER — Other Ambulatory Visit: Payer: Self-pay | Admitting: Family Medicine

## 2023-05-03 DIAGNOSIS — E038 Other specified hypothyroidism: Secondary | ICD-10-CM

## 2023-05-10 ENCOUNTER — Other Ambulatory Visit: Payer: Self-pay | Admitting: Family Medicine

## 2023-05-10 DIAGNOSIS — E1169 Type 2 diabetes mellitus with other specified complication: Secondary | ICD-10-CM

## 2023-05-27 ENCOUNTER — Telehealth: Payer: Self-pay

## 2023-05-27 ENCOUNTER — Ambulatory Visit (INDEPENDENT_AMBULATORY_CARE_PROVIDER_SITE_OTHER): Payer: Medicare Other | Admitting: Family Medicine

## 2023-05-27 ENCOUNTER — Encounter: Payer: Self-pay | Admitting: Family Medicine

## 2023-05-27 VITALS — BP 130/70 | HR 78 | Temp 97.9°F | Ht 62.0 in | Wt 198.0 lb

## 2023-05-27 DIAGNOSIS — E1169 Type 2 diabetes mellitus with other specified complication: Secondary | ICD-10-CM | POA: Diagnosis not present

## 2023-05-27 DIAGNOSIS — E785 Hyperlipidemia, unspecified: Secondary | ICD-10-CM | POA: Diagnosis not present

## 2023-05-27 DIAGNOSIS — E119 Type 2 diabetes mellitus without complications: Secondary | ICD-10-CM | POA: Diagnosis not present

## 2023-05-27 DIAGNOSIS — I10 Essential (primary) hypertension: Secondary | ICD-10-CM

## 2023-05-27 LAB — MICROALBUMIN / CREATININE URINE RATIO
Creatinine,U: 69.5 mg/dL
Microalb Creat Ratio: 1 mg/g (ref 0.0–30.0)
Microalb, Ur: <0.7 mg/dL (ref 0.0–1.9)

## 2023-05-27 LAB — LIPID PANEL
Cholesterol: 138 mg/dL (ref 0–200)
HDL: 62.3 mg/dL (ref >39.00)
LDL Cholesterol: 65 mg/dL (ref 0–99)
NonHDL: 75.41
Total CHOL/HDL Ratio: 2
Triglycerides: 54 mg/dL (ref 0.0–149.0)
VLDL: 10.8 mg/dL (ref 0.0–40.0)

## 2023-05-27 LAB — BASIC METABOLIC PANEL
BUN: 15 mg/dL (ref 6–23)
CO2: 28 mEq/L (ref 19–32)
Calcium: 9.5 mg/dL (ref 8.4–10.5)
Chloride: 101 mEq/L (ref 96–112)
Creatinine, Ser: 0.84 mg/dL (ref 0.40–1.20)
GFR: 69.27 mL/min (ref >60.00)
Glucose, Bld: 96 mg/dL (ref 70–99)
Potassium: 3.5 mEq/L (ref 3.5–5.1)
Sodium: 137 mEq/L (ref 135–145)

## 2023-05-27 LAB — HEPATIC FUNCTION PANEL
ALT: 16 U/L (ref 0–35)
AST: 17 U/L (ref 0–37)
Albumin: 4.3 g/dL (ref 3.5–5.2)
Alkaline Phosphatase: 50 U/L (ref 39–117)
Bilirubin, Direct: 0.1 mg/dL (ref 0.0–0.3)
Total Bilirubin: 0.6 mg/dL (ref 0.2–1.2)
Total Protein: 7.5 g/dL (ref 6.0–8.3)

## 2023-05-27 LAB — CBC WITH DIFFERENTIAL/PLATELET
Basophils Absolute: 0 10*3/uL (ref 0.0–0.1)
Basophils Relative: 0.6 % (ref 0.0–3.0)
Eosinophils Absolute: 0.1 10*3/uL (ref 0.0–0.7)
Eosinophils Relative: 1 % (ref 0.0–5.0)
HCT: 40.1 % (ref 36.0–46.0)
Hemoglobin: 13.1 g/dL (ref 12.0–15.0)
Lymphocytes Relative: 32.6 % (ref 12.0–46.0)
Lymphs Abs: 2 10*3/uL (ref 0.7–4.0)
MCHC: 32.6 g/dL (ref 30.0–36.0)
MCV: 87.8 fl (ref 78.0–100.0)
Monocytes Absolute: 0.4 10*3/uL (ref 0.1–1.0)
Monocytes Relative: 7 % (ref 3.0–12.0)
Neutro Abs: 3.6 10*3/uL (ref 1.4–7.7)
Neutrophils Relative %: 58.8 % (ref 43.0–77.0)
Platelets: 232 10*3/uL (ref 150.0–400.0)
RBC: 4.57 Mil/uL (ref 3.87–5.11)
RDW: 13.5 % (ref 11.5–15.5)
WBC: 6 10*3/uL (ref 4.0–10.5)

## 2023-05-27 LAB — HEMOGLOBIN A1C: Hgb A1c MFr Bld: 6.2 % (ref 4.6–6.5)

## 2023-05-27 LAB — TSH: TSH: 1.08 u[IU]/mL (ref 0.35–5.50)

## 2023-05-27 MED ORDER — ATORVASTATIN CALCIUM 20 MG PO TABS: 20.0000 mg | ORAL_TABLET | Freq: Every day | ORAL | 1 refills | Status: DC

## 2023-05-27 MED ORDER — LISINOPRIL-HYDROCHLOROTHIAZIDE 20-25 MG PO TABS: 1.0000 | ORAL_TABLET | Freq: Every day | ORAL | 1 refills | Status: DC

## 2023-05-27 MED ORDER — AMLODIPINE BESYLATE 5 MG PO TABS: 5.0000 mg | ORAL_TABLET | Freq: Every day | ORAL | 1 refills | Status: DC

## 2023-05-27 MED ORDER — POTASSIUM CHLORIDE CRYS ER 20 MEQ PO TBCR: 20.0000 meq | EXTENDED_RELEASE_TABLET | Freq: Every day | ORAL | 1 refills | Status: DC

## 2023-05-27 NOTE — Telephone Encounter (Signed)
-----   Message from Sheliah Hatch, MD sent at 05/27/2023  4:08 PM EDT ----- Labs look great!!!  No changes at this time.  Keep up the good work!

## 2023-05-27 NOTE — Progress Notes (Signed)
   Subjective:    Patient ID: Priscilla Thompson, female    DOB: Oct 08, 1951, 72 y.o.   MRN: [id number]  HPI DM- chronic problem.  Currently diet controlled.  UTD on eye exam, foot exam.  Due for microalbumin.  Last A1C 6.6%  Pt reports feeling good.  Fasting sugars 105-115.  Denies weakness/numbness of hands/feet.  HTN- chronic problem, on Lisinopril HCTZ 20/25mg  daily and Amlodipine 5mg  daily w/ adequate control.  Denies CP, SOB, HA's, visual changes, edema.  Hyperlipidemia- chronic problem, on Lipitor 20mg  daily.  No abd pain, N/V.   Review of Systems For ROS see HPI     Objective:   Physical Exam Vitals reviewed.  Constitutional:      General: She is not in acute distress.    Appearance: Normal appearance. She is well-developed. She is obese. She is not ill-appearing.  HENT:     Head: Normocephalic and atraumatic.  Eyes:     Conjunctiva/sclera: Conjunctivae normal.     Pupils: Pupils are equal, round, and reactive to light.  Neck:     Thyroid: No thyromegaly.  Cardiovascular:     Rate and Rhythm: Normal rate and regular rhythm.     Pulses: Normal pulses.     Heart sounds: Normal heart sounds. No murmur heard. Pulmonary:     Effort: Pulmonary effort is normal. No respiratory distress.     Breath sounds: Normal breath sounds.  Abdominal:     General: There is no distension.     Palpations: Abdomen is soft.     Tenderness: There is no abdominal tenderness.  Musculoskeletal:     Cervical back: Normal range of motion and neck supple.     Right lower leg: No edema.     Left lower leg: No edema.  Lymphadenopathy:     Cervical: No cervical adenopathy.  Skin:    General: Skin is warm and dry.  Neurological:     General: No focal deficit present.     Mental Status: She is alert and oriented to person, place, and time.  Psychiatric:        Mood and Affect: Mood normal.        Behavior: Behavior normal.        Thought Content: Thought content normal.            Assessment & Plan:

## 2023-05-27 NOTE — Patient Instructions (Signed)
Schedule your complete physical in 6 months We'll notify you of your lab results and make any changes if needed Continue to work on healthy diet and regular exercise- you can do it! Call with any questions or concerns Stay Safe!  Stay Healthy! Have a great summer!!! 

## 2023-05-27 NOTE — Assessment & Plan Note (Signed)
Pt's BMI is 36.21  Coupled w/ her DM, HTN, and hyperlipidemia this qualifies as morbidly obese.  Encouraged low carb diet and regular exercise.  Will follow.

## 2023-05-27 NOTE — Assessment & Plan Note (Signed)
Chronic problem.  Last A1C was excellent at 6.6%.  UTD on eye exam, foot exam.  Microalbumin ordered.  Check labs.  Start meds prn

## 2023-05-27 NOTE — Assessment & Plan Note (Signed)
Chronic problem.  On Lipitor 20mg daily w/o difficulty.  Check labs.  Adjust meds prn  ?

## 2023-05-27 NOTE — Assessment & Plan Note (Addendum)
Chronic problem.  Well controlled today on Lisinopril HCTZ 20/25mg  daily and Amlodipine 5mg  daily.  Currently asymptomatic.  Check labs due to ACE and diuretic use but no anticipated med changes.  Will follow.

## 2023-07-12 DIAGNOSIS — Z86010 Personal history of colonic polyps: Secondary | ICD-10-CM | POA: Diagnosis not present

## 2023-07-12 DIAGNOSIS — Z1211 Encounter for screening for malignant neoplasm of colon: Secondary | ICD-10-CM | POA: Diagnosis not present

## 2023-07-12 DIAGNOSIS — K5730 Diverticulosis of large intestine without perforation or abscess without bleeding: Secondary | ICD-10-CM | POA: Diagnosis not present

## 2023-07-12 LAB — HM COLONOSCOPY

## 2023-07-14 ENCOUNTER — Encounter (INDEPENDENT_AMBULATORY_CARE_PROVIDER_SITE_OTHER): Payer: Self-pay

## 2023-08-05 ENCOUNTER — Ambulatory Visit (INDEPENDENT_AMBULATORY_CARE_PROVIDER_SITE_OTHER): Payer: Medicare Other | Admitting: *Deleted

## 2023-08-05 DIAGNOSIS — Z0000 Encounter for general adult medical examination without abnormal findings: Secondary | ICD-10-CM | POA: Diagnosis not present

## 2023-08-05 NOTE — Progress Notes (Signed)
Subjective:   Priscilla Thompson is a 72 y.o. female who presents for Medicare Annual (Subsequent) preventive examination.  Visit Complete: Virtual  I connected with  Corine Shelter on 08/05/23 by a audio enabled telemedicine application and verified that I am speaking with the correct person using two identifiers.  Patient Location: Home  Provider Location: Home Office  I discussed the limitations of evaluation and management by telemedicine. The patient expressed understanding and agreed to proceed.  Patient Medicare AWV questionnaire was completed by the patient on 08-04-2023; I have confirmed that all information answered by patient is correct and no changes since this date.  Vital Signs: Unable to obtain new vitals due to this being a telehealth visit.   Review of Systems     Cardiac Risk Factors include: advanced age (>64men, >55 women);diabetes mellitus;hypertension     Objective:    There were no vitals filed for this visit. There is no height or weight on file to calculate BMI.     08/05/2023    8:11 AM 07/23/2022    9:04 AM 06/24/2020    9:55 AM  Advanced Directives  Does Patient Have a Medical Advance Directive? No No No  Would patient like information on creating a medical advance directive? No - Patient declined No - Patient declined Yes (MAU/Ambulatory/Procedural Areas - Information given)    Current Medications (verified) Outpatient Encounter Medications as of 08/05/2023  Medication Sig   acetaminophen (TYLENOL) 650 MG CR tablet Take 1,300 mg by mouth every 8 (eight) hours as needed for pain (for arthritis).   amLODipine (NORVASC) 5 MG tablet Take 1 tablet (5 mg total) by mouth daily.   aspirin 81 MG tablet Take 81 mg by mouth daily.   atorvastatin (LIPITOR) 20 MG tablet Take 1 tablet (20 mg total) by mouth daily.   Calcium Carb-Cholecalciferol (CALCIUM-VITAMIN D) 500-400 MG-UNIT TABS Take 1 tablet by mouth daily.   Cholecalciferol (VITAMIN D3) 1000 UNITS  CAPS Take 1 capsule by mouth daily.   dorzolamide-timolol (COSOPT) 22.3-6.8 MG/ML ophthalmic solution Place 1 drop into both eyes 2 (two) times daily.    fish oil-omega-3 fatty acids 1000 MG capsule Take 1,000 mg by mouth daily.    levothyroxine (SYNTHROID) 50 MCG tablet TAKE 1 TABLET BY MOUTH EVERY DAY   lisinopril-hydrochlorothiazide (ZESTORETIC) 20-25 MG tablet Take 1 tablet by mouth daily.   loratadine (CLARITIN) 10 MG tablet Take 10 mg by mouth daily.   Multiple Vitamins-Minerals (MULTI FOR HER 50+ PO) Take 2 chew by mouth daily   omeprazole (PRILOSEC) 20 MG capsule Take 20 mg by mouth 3 (three) times a week.    potassium chloride SA (KLOR-CON M20) 20 MEQ tablet Take 1 tablet (20 mEq total) by mouth daily.   No facility-administered encounter medications on file as of 08/05/2023.    Allergies (verified) Patient has no known allergies.   History: Past Medical History:  Diagnosis Date   COVID-19    Diabetes mellitus    Hyperlipidemia    Hypertension    Hypothyroid    Osteoarthritis    Thyroid disease    History reviewed. No pertinent surgical history. Family History  Problem Relation Age of Onset   Diabetes Sister    Diabetes Brother    Cancer Brother        liver, lung   Social History   Socioeconomic History   Marital status: Single    Spouse name: Not on file   Number of children: Not on file  Years of education: Not on file   Highest education level: 12th grade  Occupational History   Not on file  Tobacco Use   Smoking status: Never   Smokeless tobacco: Never  Vaping Use   Vaping status: Never Used  Substance and Sexual Activity   Alcohol use: Yes    Alcohol/week: 0.0 standard drinks of alcohol    Comment: socially   Drug use: No   Sexual activity: Not Currently  Other Topics Concern   Not on file  Social History Narrative   Not on file   Social Determinants of Health   Financial Resource Strain: Low Risk  (08/05/2023)   Overall Financial Resource  Strain (CARDIA)    Difficulty of Paying Living Expenses: Not hard at all  Food Insecurity: No Food Insecurity (08/05/2023)   Hunger Vital Sign    Worried About Running Out of Food in the Last Year: Never true    Ran Out of Food in the Last Year: Never true  Transportation Needs: No Transportation Needs (08/05/2023)   PRAPARE - Administrator, Civil Service (Medical): No    Lack of Transportation (Non-Medical): No  Physical Activity: Inactive (08/05/2023)   Exercise Vital Sign    Days of Exercise per Week: 0 days    Minutes of Exercise per Session: 0 min  Stress: No Stress Concern Present (08/05/2023)   [employer] of Occupational Health - Occupational Stress Questionnaire    Feeling of Stress : Not at all  Social Connections: Moderately Integrated (08/05/2023)   Social Connection and Isolation Panel [NHANES]    Frequency of Communication with Friends and Family: More than three times a week    Frequency of Social Gatherings with Friends and Family: Twice a week    Attends Religious Services: More than 4 times per year    Active Member of [employer] or Organizations: Yes    Attends Engineer, structural: More than 4 times per year    Marital Status: Never married  Recent Concern: Social Connections - Moderately Isolated (05/25/2023)   Social Connection and Isolation Panel [NHANES]    Frequency of Communication with Friends and Family: More than three times a week    Frequency of Social Gatherings with Friends and Family: More than three times a week    Attends Religious Services: More than 4 times per year    Active Member of [employer] or Organizations: No    Attends Engineer, structural: Not on file    Marital Status: Never married    Tobacco Counseling Counseling given: Not Answered   Clinical Intake:              How often do you need to have someone help you when you read instructions, pamphlets, or other written materials from your doctor or  pharmacy?: (P) 1 - Never         Activities of Daily Living    08/05/2023    8:06 AM 08/04/2023    8:09 AM  In your present state of health, do you have any difficulty performing the following activities:  Hearing? 0 0  Vision? 0 0  Difficulty concentrating or making decisions? 0 0  Walking or climbing stairs? 0 0  Dressing or bathing? 0 0  Doing errands, shopping? 0 0  Preparing Food and eating ? N N  Using the Toilet? N N  In the past six months, have you accidently leaked urine? N N  Do you have problems with  loss of bowel control? N N  Managing your Medications? N N  Managing your Finances? N N  Housekeeping or managing your Housekeeping?  N    Patient Care Team: Sheliah Hatch, MD as PCP - General Marcelle Overlie, MD as Consulting Physician (Obstetrics and Gynecology) Ernesto Rutherford, MD as Consulting Physician (Ophthalmology) Elliot Cousin, OD (Optometry) Charna Elizabeth, MD as Consulting Physician (Gastroenterology) Erroll Luna, [facility] (Inactive) (Pharmacist)  Indicate any recent Medical Services you may have received from other than [facility] providers in the past year (date may be approximate).     Assessment:   This is a routine wellness examination for [place].  Hearing/Vision screen Hearing Screening - Comments:: No trouble hearing  Dietary issues and exercise activities discussed:     Goals Addressed             This Visit's Progress    Increase physical activity         Depression Screen    08/05/2023    8:09 AM 05/27/2023    7:47 AM 11/24/2022    9:59 AM 07/23/2022    9:03 AM 07/23/2022    9:02 AM 05/18/2022    8:30 AM 11/18/2021   12:37 PM  PHQ 2/9 Scores  PHQ - 2 Score 0 0 0 0 0 0 0  PHQ- 9 Score 0 0 0   0 0    Fall Risk    08/05/2023    8:11 AM 08/04/2023    8:09 AM 05/27/2023    7:42 AM 11/24/2022    9:59 AM 07/23/2022    9:05 AM  Fall Risk   Falls in the past year? 0 0 0 1 1  Number falls in past yr: 0  0 0 0  Injury with  Fall? 0  0 0 0  Risk for fall due to :   No Fall Risks History of fall(s)   Follow up Falls evaluation completed;Education provided;Falls prevention discussed  Falls evaluation completed Falls evaluation completed Falls evaluation completed;Education provided    MEDICARE RISK AT HOME: Medicare Risk at Home Any stairs in or around the home?: Yes If so, are there any without handrails?: No Home free of loose throw rugs in walkways, pet beds, electrical cords, etc?: Yes Adequate lighting in your home to reduce risk of falls?: Yes Life alert?: No Use of a cane, walker or w/c?: No Grab bars in the bathroom?: No Shower chair or bench in shower?: No Elevated toilet seat or a handicapped toilet?: No  TIMED UP AND GO:  Was the test performed?  No    Cognitive Function:        08/05/2023    8:14 AM 07/23/2022    9:02 AM 06/24/2020   10:08 AM  6CIT Screen  What Year?  0 points 0 points  What month? 0 points 0 points 0 points  What time? 0 points 0 points 0 points  Count back from 20 0 points 0 points 0 points  Months in reverse 0 points 0 points 0 points  Repeat phrase 0 points 0 points 0 points  Total Score  0 points 0 points    Immunizations Immunization History  Administered Date(s) Administered   Covid-19, Mrna,Vaccine(Spikevax)72yrs and older 02/27/2023   Fluad Quad(high Dose 65+) 08/08/2019, 11/01/2020, 11/18/2021   Influenza Split 12/23/2012   Influenza, High Dose Seasonal PF 08/31/2022   Influenza,inj,Quad PF,6+ Mos 11/06/2013, 10/08/2014, 09/23/2015, 09/10/2016, 09/17/2017, 07/29/2018   Influenza-Unspecified 08/31/2022   Moderna SARS-COV2 Booster Vaccination 02/27/2023  PFIZER SARS-COV-2 Pediatric Vaccination 5-75yrs 10/25/2020   PFIZER(Purple Top)SARS-COV-2 Vaccination 02/26/2020, 03/20/2020, 10/25/2020   PNEUMOCOCCAL CONJUGATE-20 05/18/2022, 02/27/2023   Pneumococcal Conjugate-13 09/17/2017   Pneumococcal Polysaccharide-23 10/08/2014, 02/27/2023   RSV,unspecified  02/27/2023   Respiratory Syncytial Virus Vaccine,Recomb Aduvanted(Arexvy) 02/27/2023, 02/27/2023   Tdap 04/14/2013   Zoster, Live 11/20/2014    TDAP status: Due, Education has been provided regarding the importance of this vaccine. Advised may receive this vaccine at local pharmacy or Health Dept. Aware to provide a copy of the vaccination record if obtained from local pharmacy or Health Dept. Verbalized acceptance and understanding.  Flu Vaccine status: Up to date  Pneumococcal vaccine status: Up to date  Covid-19 vaccine status: Information provided on how to obtain vaccines.   Qualifies for Shingles Vaccine? No   Zostavax completed No   Shingrix Completed?: No.    Education has been provided regarding the importance of this vaccine. Patient has been advised to call insurance company to determine out of pocket expense if they have not yet received this vaccine. Advised may also receive vaccine at local pharmacy or Health Dept. Verbalized acceptance and understanding.  Screening Tests Health Maintenance  Topic Date Due   INFLUENZA VACCINE  07/15/2023   OPHTHALMOLOGY EXAM  10/08/2023   MAMMOGRAM  10/20/2023   FOOT EXAM  11/25/2023   HEMOGLOBIN A1C  11/26/2023   Diabetic kidney evaluation - eGFR measurement  05/26/2024   Diabetic kidney evaluation - Urine ACR  05/26/2024   Medicare Annual Wellness (AWV)  08/04/2024   DEXA SCAN  04/04/2025   Colonoscopy  07/11/2028   Pneumonia Vaccine 10+ Years old  Completed   Hepatitis C Screening  Completed   HPV VACCINES  Aged Out   DTaP/Tdap/Td  Discontinued   COVID-19 Vaccine  Discontinued   Zoster Vaccines- Shingrix  Discontinued    Health Maintenance  Health Maintenance Due  Topic Date Due   INFLUENZA VACCINE  07/15/2023    Colorectal cancer screening: Type of screening: Colonoscopy. Completed 2024. Repeat every 5 years  Mammogram status: Completed  . Repeat every year  Bone Density status: Completed 2024. Results reflect: Bone  density results: NORMAL. Repeat every 2 years.  Lung Cancer Screening: (Low Dose CT Chest recommended if Age 37-80 years, 20 pack-year currently smoking OR have quit w/in 15years.) does not qualify.   Lung Cancer Screening Referral:   Additional Screening:  Hepatitis C Screening: does not qualify; Completed 2022  Vision Screening: Recommended annual ophthalmology exams for early detection of glaucoma and other disorders of the eye. Is the patient up to date with their annual eye exam?  Yes  Who is the provider or what is the name of the office in which the patient attends annual eye exams? [facility] If pt is not established with a provider, would they like to be referred to a provider to establish care? No .   Dental Screening: Recommended annual dental exams for proper oral hygiene  Diabetes is diet controlled patient does not check blood sugar   Community Resource Referral / Chronic Care Management: CRR required this visit?  No   CCM required this visit?  No     Plan:     I have personally reviewed and noted the following in the patient's chart:   Medical and social history Use of alcohol, tobacco or illicit drugs  Current medications and supplements including opioid prescriptions. Patient is not currently taking opioid prescriptions. Functional ability and status Nutritional status Physical activity Advanced directives List of other  physicians Hospitalizations, surgeries, and ER visits in previous 12 months Vitals Screenings to include cognitive, depression, and falls Referrals and appointments  In addition, I have reviewed and discussed with patient certain preventive protocols, quality metrics, and best practice recommendations. A written personalized care plan for preventive services as well as general preventive health recommendations were provided to patient.     Remi Haggard, LPN   03/22/8118   After Visit Summary: (MyChart) Due to this being a telephonic visit,  the after visit summary with patients personalized plan was offered to patient via MyChart   Nurse Notes:

## 2023-08-05 NOTE — Patient Instructions (Signed)
Priscilla Thompson , Thank you for taking time to come for your Medicare Wellness Visit. I appreciate your ongoing commitment to your health goals. Please review the following plan we discussed and let me know if I can assist you in the future.   Screening recommendations/referrals: Colonoscopy: up to date Mammogram: up to date Bone Density: up to date Recommended yearly ophthalmology/optometry visit for glaucoma screening and checkup Recommended yearly dental visit for hygiene and checkup  Vaccinations: Influenza vaccine: up to date Pneumococcal vaccine: up to date Tdap vaccine: Education provided Shingles vaccine: Education provided    Advanced directives: Education provided     Preventive Care 73 Years and Older, Female Preventive care refers to lifestyle choices and visits with your health care provider that can promote health and wellness. What does preventive care include? A yearly physical exam. This is also called an annual well check. Dental exams once or twice a year. Routine eye exams. Ask your health care provider how often you should have your eyes checked. Personal lifestyle choices, including: Daily care of your teeth and gums. Regular physical activity. Eating a healthy diet. Avoiding tobacco and drug use. Limiting alcohol use. Practicing safe sex. Taking low-dose aspirin every day. Taking vitamin and mineral supplements as recommended by your health care provider. What happens during an annual well check? The services and screenings done by your health care provider during your annual well check will depend on your age, overall health, lifestyle risk factors, and family history of disease. Counseling  Your health care provider may ask you questions about your: Alcohol use. Tobacco use. Drug use. Emotional well-being. Home and relationship well-being. Sexual activity. Eating habits. History of falls. Memory and ability to understand (cognition). Work and work  Astronomer. Reproductive health. Screening  You may have the following tests or measurements: Height, weight, and BMI. Blood pressure. Lipid and cholesterol levels. These may be checked every 5 years, or more frequently if you are over 55 years old. Skin check. Lung cancer screening. You may have this screening every year starting at age 73 if you have a 30-pack-year history of smoking and currently smoke or have quit within the past 15 years. Fecal occult blood test (FOBT) of the stool. You may have this test every year starting at age 73. Flexible sigmoidoscopy or colonoscopy. You may have a sigmoidoscopy every 5 years or a colonoscopy every 10 years starting at age 73. Hepatitis C blood test. Hepatitis B blood test. Sexually transmitted disease (STD) testing. Diabetes screening. This is done by checking your blood sugar (glucose) after you have not eaten for a while (fasting). You may have this done every 1-3 years. Bone density scan. This is done to screen for osteoporosis. You may have this done starting at age 73. Mammogram. This may be done every 1-2 years. Talk to your health care provider about how often you should have regular mammograms. Talk with your health care provider about your test results, treatment options, and if necessary, the need for more tests. Vaccines  Your health care provider may recommend certain vaccines, such as: Influenza vaccine. This is recommended every year. Tetanus, diphtheria, and acellular pertussis (Tdap, Td) vaccine. You may need a Td booster every 10 years. Zoster vaccine. You may need this after age 73. Pneumococcal 13-valent conjugate (PCV13) vaccine. One dose is recommended after age 73. Pneumococcal polysaccharide (PPSV23) vaccine. One dose is recommended after age 73. Talk to your health care provider about which screenings and vaccines you need and how often  you need them. This information is not intended to replace advice given to you by  your health care provider. Make sure you discuss any questions you have with your health care provider. Document Released: 12/27/2015 Document Revised: 08/19/2016 Document Reviewed: 10/01/2015 Elsevier Interactive Patient Education  2017 [employer].  Fall Prevention in the Home Falls can cause injuries. They can happen to people of all ages. There are many things you can do to make your home safe and to help prevent falls. What can I do on the outside of my home? Regularly fix the edges of walkways and driveways and fix any cracks. Remove anything that might make you trip as you walk through a door, such as a raised step or threshold. Trim any bushes or trees on the path to your home. Use bright outdoor lighting. Clear any walking paths of anything that might make someone trip, such as rocks or tools. Regularly check to see if handrails are loose or broken. Make sure that both sides of any steps have handrails. Any raised decks and porches should have guardrails on the edges. Have any leaves, snow, or ice cleared regularly. Use sand or salt on walking paths during winter. Clean up any spills in your garage right away. This includes oil or grease spills. What can I do in the bathroom? Use night lights. Install grab bars by the toilet and in the tub and shower. Do not use towel bars as grab bars. Use non-skid mats or decals in the tub or shower. If you need to sit down in the shower, use a plastic, non-slip stool. Keep the floor dry. Clean up any water that spills on the floor as soon as it happens. Remove soap buildup in the tub or shower regularly. Attach bath mats securely with double-sided non-slip rug tape. Do not have throw rugs and other things on the floor that can make you trip. What can I do in the bedroom? Use night lights. Make sure that you have a light by your bed that is easy to reach. Do not use any sheets or blankets that are too big for your bed. They should not hang  down onto the floor. Have a firm chair that has side arms. You can use this for support while you get dressed. Do not have throw rugs and other things on the floor that can make you trip. What can I do in the kitchen? Clean up any spills right away. Avoid walking on wet floors. Keep items that you use a lot in easy-to-reach places. If you need to reach something above you, use a strong step stool that has a grab bar. Keep electrical cords out of the way. Do not use floor polish or wax that makes floors slippery. If you must use wax, use non-skid floor wax. Do not have throw rugs and other things on the floor that can make you trip. What can I do with my stairs? Do not leave any items on the stairs. Make sure that there are handrails on both sides of the stairs and use them. Fix handrails that are broken or loose. Make sure that handrails are as long as the stairways. Check any carpeting to make sure that it is firmly attached to the stairs. Fix any carpet that is loose or worn. Avoid having throw rugs at the top or bottom of the stairs. If you do have throw rugs, attach them to the floor with carpet tape. Make sure that you have a light  switch at the top of the stairs and the bottom of the stairs. If you do not have them, ask someone to add them for you. What else can I do to help prevent falls? Wear shoes that: Do not have high heels. Have rubber bottoms. Are comfortable and fit you well. Are closed at the toe. Do not wear sandals. If you use a stepladder: Make sure that it is fully opened. Do not climb a closed stepladder. Make sure that both sides of the stepladder are locked into place. Ask someone to hold it for you, if possible. Clearly mark and make sure that you can see: Any grab bars or handrails. First and last steps. Where the edge of each step is. Use tools that help you move around (mobility aids) if they are needed. These  include: Canes. Walkers. Scooters. Crutches. Turn on the lights when you go into a dark area. Replace any light bulbs as soon as they burn out. Set up your furniture so you have a clear path. Avoid moving your furniture around. If any of your floors are uneven, fix them. If there are any pets around you, be aware of where they are. Review your medicines with your doctor. Some medicines can make you feel dizzy. This can increase your chance of falling. Ask your doctor what other things that you can do to help prevent falls. This information is not intended to replace advice given to you by your health care provider. Make sure you discuss any questions you have with your health care provider. Document Released: 09/26/2009 Document Revised: 05/07/2016 Document Reviewed: 01/04/2015 Elsevier Interactive Patient Education  2017 [employer].

## 2023-09-06 ENCOUNTER — Other Ambulatory Visit: Payer: Self-pay | Admitting: Family Medicine

## 2023-09-06 NOTE — Telephone Encounter (Signed)
Last office visit 05/27/2023 Last refill 05/27/2023 Upcoming appt 12/01/2023

## 2023-10-14 DIAGNOSIS — H04123 Dry eye syndrome of bilateral lacrimal glands: Secondary | ICD-10-CM | POA: Diagnosis not present

## 2023-10-14 DIAGNOSIS — H43812 Vitreous degeneration, left eye: Secondary | ICD-10-CM | POA: Diagnosis not present

## 2023-10-14 DIAGNOSIS — H31101 Choroidal degeneration, unspecified, right eye: Secondary | ICD-10-CM | POA: Diagnosis not present

## 2023-10-14 DIAGNOSIS — E119 Type 2 diabetes mellitus without complications: Secondary | ICD-10-CM | POA: Diagnosis not present

## 2023-10-14 DIAGNOSIS — H40023 Open angle with borderline findings, high risk, bilateral: Secondary | ICD-10-CM | POA: Diagnosis not present

## 2023-10-14 DIAGNOSIS — H2513 Age-related nuclear cataract, bilateral: Secondary | ICD-10-CM | POA: Diagnosis not present

## 2023-10-14 LAB — HM DIABETES EYE EXAM

## 2023-10-24 ENCOUNTER — Other Ambulatory Visit: Payer: Self-pay | Admitting: Family Medicine

## 2023-10-24 DIAGNOSIS — E038 Other specified hypothyroidism: Secondary | ICD-10-CM

## 2023-10-25 DIAGNOSIS — Z1231 Encounter for screening mammogram for malignant neoplasm of breast: Secondary | ICD-10-CM | POA: Diagnosis not present

## 2023-10-25 LAB — HM MAMMOGRAPHY: HM Mammogram: NORMAL (ref 0–4)

## 2023-10-27 ENCOUNTER — Telehealth: Payer: Medicare Other

## 2023-10-27 ENCOUNTER — Encounter: Payer: Medicare Other | Admitting: Pharmacist

## 2023-12-01 ENCOUNTER — Encounter: Payer: Self-pay | Admitting: Family Medicine

## 2023-12-01 ENCOUNTER — Ambulatory Visit (INDEPENDENT_AMBULATORY_CARE_PROVIDER_SITE_OTHER): Payer: Medicare Other | Admitting: Family Medicine

## 2023-12-01 VITALS — BP 114/72 | HR 78 | Temp 97.8°F | Ht 62.0 in | Wt 198.0 lb

## 2023-12-01 DIAGNOSIS — Z0000 Encounter for general adult medical examination without abnormal findings: Secondary | ICD-10-CM | POA: Diagnosis not present

## 2023-12-01 DIAGNOSIS — E559 Vitamin D deficiency, unspecified: Secondary | ICD-10-CM | POA: Diagnosis not present

## 2023-12-01 DIAGNOSIS — Z23 Encounter for immunization: Secondary | ICD-10-CM | POA: Diagnosis not present

## 2023-12-01 DIAGNOSIS — E119 Type 2 diabetes mellitus without complications: Secondary | ICD-10-CM | POA: Diagnosis not present

## 2023-12-01 LAB — CBC WITH DIFFERENTIAL/PLATELET
Basophils Absolute: 0 10*3/uL (ref 0.0–0.1)
Basophils Relative: 0.6 % (ref 0.0–3.0)
Eosinophils Absolute: 0.1 10*3/uL (ref 0.0–0.7)
Eosinophils Relative: 1.4 % (ref 0.0–5.0)
HCT: 39.6 % (ref 36.0–46.0)
Hemoglobin: 12.8 g/dL (ref 12.0–15.0)
Lymphocytes Relative: 36.8 % (ref 12.0–46.0)
Lymphs Abs: 2.2 10*3/uL (ref 0.7–4.0)
MCHC: 32.3 g/dL (ref 30.0–36.0)
MCV: 89.1 fL (ref 78.0–100.0)
Monocytes Absolute: 0.5 10*3/uL (ref 0.1–1.0)
Monocytes Relative: 8.1 % (ref 3.0–12.0)
Neutro Abs: 3.2 10*3/uL (ref 1.4–7.7)
Neutrophils Relative %: 53.1 % (ref 43.0–77.0)
Platelets: 236 10*3/uL (ref 150.0–400.0)
RBC: 4.44 Mil/uL (ref 3.87–5.11)
RDW: 13.9 % (ref 11.5–15.5)
WBC: 6 10*3/uL (ref 4.0–10.5)

## 2023-12-01 LAB — HEPATIC FUNCTION PANEL
ALT: 15 U/L (ref 0–35)
AST: 16 U/L (ref 0–37)
Albumin: 4.4 g/dL (ref 3.5–5.2)
Alkaline Phosphatase: 55 U/L (ref 39–117)
Bilirubin, Direct: 0.1 mg/dL (ref 0.0–0.3)
Total Bilirubin: 0.6 mg/dL (ref 0.2–1.2)
Total Protein: 7.5 g/dL (ref 6.0–8.3)

## 2023-12-01 LAB — BASIC METABOLIC PANEL
BUN: 13 mg/dL (ref 6–23)
CO2: 28 meq/L (ref 19–32)
Calcium: 9.6 mg/dL (ref 8.4–10.5)
Chloride: 104 meq/L (ref 96–112)
Creatinine, Ser: 0.85 mg/dL (ref 0.40–1.20)
GFR: 68.05 mL/min (ref >60.00)
Glucose, Bld: 95 mg/dL (ref 70–99)
Potassium: 4.1 meq/L (ref 3.5–5.1)
Sodium: 141 meq/L (ref 135–145)

## 2023-12-01 LAB — LIPID PANEL
Cholesterol: 137 mg/dL (ref 0–200)
HDL: 63.9 mg/dL (ref >39.00)
LDL Cholesterol: 64 mg/dL (ref 0–99)
NonHDL: 73.3
Total CHOL/HDL Ratio: 2
Triglycerides: 49 mg/dL (ref 0.0–149.0)
VLDL: 9.8 mg/dL (ref 0.0–40.0)

## 2023-12-01 LAB — HEMOGLOBIN A1C: Hgb A1c MFr Bld: 6.3 % (ref 4.6–6.5)

## 2023-12-01 LAB — VITAMIN D 25 HYDROXY (VIT D DEFICIENCY, FRACTURES): VITD: 84.63 ng/mL (ref 30.00–100.00)

## 2023-12-01 LAB — TSH: TSH: 0.92 u[IU]/mL (ref 0.35–5.50)

## 2023-12-01 MED ORDER — CYCLOBENZAPRINE HCL 5 MG PO TABS: ORAL_TABLET | ORAL | 0 refills | Status: DC

## 2023-12-01 NOTE — Assessment & Plan Note (Signed)
Check labs and replete prn. 

## 2023-12-01 NOTE — Progress Notes (Signed)
   Subjective:    Patient ID: Priscilla Thompson, female    DOB: [birth year], 72 y.o.   MRN: [id number]  HPI CPE- UTD on pap, mammo, colonoscopy, DEXA.  UTD on PNA.  Due for flu  Patient Care Team    Relationship Specialty Notifications Start End  Sheliah Hatch, MD PCP - General   11/26/10   Marcelle Overlie, MD Consulting Physician Obstetrics and Gynecology  06/14/15   Ernesto Rutherford, MD Consulting Physician Ophthalmology  06/14/15   Elliot Cousin, OD  Optometry  06/14/15   Charna Elizabeth, MD Consulting Physician Gastroenterology  06/14/15   Erroll Luna, [facility] (Inactive)  Pharmacist  10/21/22    Comment: [phone]     Health Maintenance  Topic Date Due   DTaP/Tdap/Td (2 - Td or Tdap) 04/15/2023   INFLUENZA VACCINE  07/15/2023   COVID-19 Vaccine (6 - 2024-25 season) 08/15/2023   HEMOGLOBIN A1C  11/26/2023   Diabetic kidney evaluation - eGFR measurement  05/26/2024   Diabetic kidney evaluation - Urine ACR  05/26/2024   Medicare Annual Wellness (AWV)  08/04/2024   OPHTHALMOLOGY EXAM  10/13/2024   MAMMOGRAM  10/24/2024   FOOT EXAM  11/30/2024   DEXA SCAN  04/04/2025   Colonoscopy  07/11/2028   Pneumonia Vaccine 21+ Years old  Completed   Hepatitis C Screening  Completed   HPV VACCINES  Aged Out   Zoster Vaccines- Shingrix  Discontinued      Review of Systems Patient reports no vision/ hearing changes, adenopathy,fever, weight change,  persistant/recurrent hoarseness , swallowing issues, chest pain, palpitations, edema, persistant/recurrent cough, hemoptysis, dyspnea (rest/exertional/paroxysmal nocturnal), gastrointestinal bleeding (melena, rectal bleeding), abdominal pain, significant heartburn, bowel changes, GU symptoms (dysuria, hematuria, incontinence), Gyn symptoms (abnormal  bleeding, pain),  syncope, focal weakness, memory loss, numbness & tingling, skin/hair/nail changes, abnormal bruising or bleeding, anxiety, or depression.     Objective:   Physical Exam General  Appearance:    Alert, cooperative, no distress, appears stated age, obese  Head:    Normocephalic, without obvious abnormality, atraumatic  Eyes:    PERRL, conjunctiva/corneas clear, EOM's intact both eyes  Ears:    Normal TM's and external ear canals, both ears  Nose:   Nares normal, septum midline, mucosa normal, no drainage    or sinus tenderness  Throat:   Lips, mucosa, and tongue normal; teeth and gums normal  Neck:   Supple, symmetrical, trachea midline, no adenopathy;    Thyroid: no enlargement/tenderness/nodules  Back:     Symmetric, no curvature, ROM normal, no CVA tenderness  Lungs:     Clear to auscultation bilaterally, respirations unlabored  Chest Wall:    No tenderness or deformity   Heart:    Regular rate and rhythm, S1 and S2 normal, no murmur, rub   or gallop  Breast Exam:    Deferred to GYN  Abdomen:     Soft, non-tender, bowel sounds active all four quadrants,    no masses, no organomegaly  Genitalia:    Deferred to GYN  Rectal:    Extremities:   Extremities normal, atraumatic, no cyanosis or edema  Pulses:   2+ and symmetric all extremities  Skin:   Skin color, texture, turgor normal, no rashes or lesions  Lymph nodes:   Cervical, supraclavicular, and axillary nodes normal  Neurologic:   CNII-XII intact, normal strength, sensation and reflexes    throughout          Assessment & Plan:

## 2023-12-01 NOTE — Assessment & Plan Note (Signed)
Pt's PE WNL w/ exception of BMI.  UTD on pap, mammo, colonoscopy, DEXA.  Flu shot given today.  Will get Tdap at pharmacy.  Check labs.  Anticipatory guidance provided.

## 2023-12-01 NOTE — Patient Instructions (Signed)
Follow up in 6 months to recheck sugar, blood pressure, and cholesterol We'll notify you of your lab results and make any changes if needed Continue to work on healthy diet and regular exercise- you can do it!! Call with any questions or concerns Stay Safe!  Stay Healthy! Happy Holidays!!!

## 2023-12-01 NOTE — Assessment & Plan Note (Signed)
UTD on eye exam, microalbumin.  Foot exam done today.  Check labs.

## 2023-12-03 ENCOUNTER — Telehealth: Payer: Self-pay

## 2023-12-03 NOTE — Telephone Encounter (Signed)
Pt has been notified and no concerns

## 2023-12-03 NOTE — Telephone Encounter (Signed)
-----   Message from Neena Rhymes sent at 12/03/2023  3:44 PM EST ----- Labs look great!  No changes at this time

## 2023-12-14 ENCOUNTER — Other Ambulatory Visit: Payer: Self-pay | Admitting: Family Medicine

## 2024-01-19 ENCOUNTER — Telehealth: Payer: Self-pay | Admitting: Family Medicine

## 2024-01-19 NOTE — Telephone Encounter (Signed)
 Type of form received:[employer]   Additional comments: House Calls Visit Summary   Received ab:[id number]- Front Desk   Form should be Faxed/mailed to: N/A  Is patient requesting call for pickup:N/A  Form placed: [employer] charge sheet.  Provider will determine charge. N/A  Individual made aware of 3-5 business day turn around No?  FYI Only

## 2024-01-30 ENCOUNTER — Other Ambulatory Visit: Payer: Self-pay | Admitting: Family Medicine

## 2024-01-30 DIAGNOSIS — E1169 Type 2 diabetes mellitus with other specified complication: Secondary | ICD-10-CM

## 2024-02-29 ENCOUNTER — Other Ambulatory Visit: Payer: Self-pay | Admitting: Family Medicine

## 2024-02-29 DIAGNOSIS — I10 Essential (primary) hypertension: Secondary | ICD-10-CM

## 2024-05-20 ENCOUNTER — Other Ambulatory Visit: Payer: Self-pay | Admitting: Family Medicine

## 2024-05-27 ENCOUNTER — Other Ambulatory Visit: Payer: Self-pay | Admitting: Family Medicine

## 2024-05-27 DIAGNOSIS — E038 Other specified hypothyroidism: Secondary | ICD-10-CM

## 2024-06-05 ENCOUNTER — Ambulatory Visit (INDEPENDENT_AMBULATORY_CARE_PROVIDER_SITE_OTHER): Payer: Medicare Other | Admitting: Family Medicine

## 2024-06-05 ENCOUNTER — Encounter: Payer: Self-pay | Admitting: Family Medicine

## 2024-06-05 VITALS — BP 122/74 | HR 86 | Temp 98.3°F | Resp 15 | Ht 62.0 in | Wt 193.3 lb

## 2024-06-05 DIAGNOSIS — E785 Hyperlipidemia, unspecified: Secondary | ICD-10-CM | POA: Diagnosis not present

## 2024-06-05 DIAGNOSIS — E119 Type 2 diabetes mellitus without complications: Secondary | ICD-10-CM

## 2024-06-05 DIAGNOSIS — I10 Essential (primary) hypertension: Secondary | ICD-10-CM

## 2024-06-05 DIAGNOSIS — E1169 Type 2 diabetes mellitus with other specified complication: Secondary | ICD-10-CM | POA: Diagnosis not present

## 2024-06-05 LAB — CBC WITH DIFFERENTIAL/PLATELET
Basophils Absolute: 0 10*3/uL (ref 0.0–0.1)
Basophils Relative: 0.5 % (ref 0.0–3.0)
Eosinophils Absolute: 0.1 10*3/uL (ref 0.0–0.7)
Eosinophils Relative: 1.7 % (ref 0.0–5.0)
HCT: 38.8 % (ref 36.0–46.0)
Hemoglobin: 12.8 g/dL (ref 12.0–15.0)
Lymphocytes Relative: 37.1 % (ref 12.0–46.0)
Lymphs Abs: 2 10*3/uL (ref 0.7–4.0)
MCHC: 33 g/dL (ref 30.0–36.0)
MCV: 87 fl (ref 78.0–100.0)
Monocytes Absolute: 0.4 10*3/uL (ref 0.1–1.0)
Monocytes Relative: 7.5 % (ref 3.0–12.0)
Neutro Abs: 2.8 10*3/uL (ref 1.4–7.7)
Neutrophils Relative %: 53.2 % (ref 43.0–77.0)
Platelets: 224 10*3/uL (ref 150.0–400.0)
RBC: 4.46 Mil/uL (ref 3.87–5.11)
RDW: 14.2 % (ref 11.5–15.5)
WBC: 5.3 10*3/uL (ref 4.0–10.5)

## 2024-06-05 LAB — BASIC METABOLIC PANEL WITH GFR
BUN: 13 mg/dL (ref 6–23)
CO2: 30 meq/L (ref 19–32)
Calcium: 9.8 mg/dL (ref 8.4–10.5)
Chloride: 103 meq/L (ref 96–112)
Creatinine, Ser: 0.82 mg/dL (ref 0.40–1.20)
GFR: 70.79 mL/min (ref >60.00)
Glucose, Bld: 96 mg/dL (ref 70–99)
Potassium: 3.8 meq/L (ref 3.5–5.1)
Sodium: 142 meq/L (ref 135–145)

## 2024-06-05 LAB — HEPATIC FUNCTION PANEL
ALT: 18 U/L (ref 0–35)
AST: 19 U/L (ref 0–37)
Albumin: 4.4 g/dL (ref 3.5–5.2)
Alkaline Phosphatase: 47 U/L (ref 39–117)
Bilirubin, Direct: 0.1 mg/dL (ref 0.0–0.3)
Total Bilirubin: 0.6 mg/dL (ref 0.2–1.2)
Total Protein: 7.5 g/dL (ref 6.0–8.3)

## 2024-06-05 LAB — LIPID PANEL
Cholesterol: 143 mg/dL (ref 0–200)
HDL: 67.9 mg/dL (ref >39.00)
LDL Cholesterol: 63 mg/dL (ref 0–99)
NonHDL: 74.76
Total CHOL/HDL Ratio: 2
Triglycerides: 59 mg/dL (ref 0.0–149.0)
VLDL: 11.8 mg/dL (ref 0.0–40.0)

## 2024-06-05 LAB — MICROALBUMIN / CREATININE URINE RATIO
Creatinine,U: 68.1 mg/dL
Microalb Creat Ratio: UNDETERMINED mg/g (ref 0.0–30.0)
Microalb, Ur: <0.7 mg/dL

## 2024-06-05 LAB — HEMOGLOBIN A1C: Hgb A1c MFr Bld: 6.2 % (ref 4.6–6.5)

## 2024-06-05 LAB — TSH: TSH: 1.1 u[IU]/mL (ref 0.35–5.50)

## 2024-06-05 NOTE — Progress Notes (Signed)
   Subjective:    Patient ID: Priscilla Thompson, female    DOB: Jun 30, 1951, 73 y.o.   MRN: [id number]  HPI DM- ongoing issue.  Currently diet controlled.  UTD on eye exam, foot exam.  Due for A1C and microalbumin.  She is down 5 lbs since last visit.  No numbness/tingling of hands/feet.  HTN- chronic problem, on Amlodipine  5mg  daily, Lisinopril  hydrochlorothiazide  20/25mg  daily w/ good control.  No CP, SOB, HA's, visual changes, edema.  Hyperlipidemia- chronic problem, on Lipitor 20mg  daily.  No abd pain, N/V.   Review of Systems For ROS see HPI     Objective:   Physical Exam Vitals reviewed.  Constitutional:      General: She is not in acute distress.    Appearance: Normal appearance. She is well-developed. She is obese. She is not ill-appearing.  HENT:     Head: Normocephalic and atraumatic.   Eyes:     Conjunctiva/sclera: Conjunctivae normal.     Pupils: Pupils are equal, round, and reactive to light.   Neck:     Thyroid : No thyromegaly.   Cardiovascular:     Rate and Rhythm: Normal rate and regular rhythm.     Pulses: Normal pulses.     Heart sounds: Normal heart sounds. No murmur heard. Pulmonary:     Effort: Pulmonary effort is normal. No respiratory distress.     Breath sounds: Normal breath sounds.  Abdominal:     General: There is no distension.     Palpations: Abdomen is soft.     Tenderness: There is no abdominal tenderness.   Musculoskeletal:     Cervical back: Normal range of motion and neck supple.     Right lower leg: No edema.     Left lower leg: No edema.  Lymphadenopathy:     Cervical: No cervical adenopathy.   Skin:    General: Skin is warm and dry.   Neurological:     General: No focal deficit present.     Mental Status: She is alert and oriented to person, place, and time.   Psychiatric:        Mood and Affect: Mood normal.        Behavior: Behavior normal.        Thought Content: Thought content normal.           Assessment &  Plan:

## 2024-06-05 NOTE — Assessment & Plan Note (Signed)
 Chronic problem.  Well controlled on Amlodipine  and Lisinopril  hydrochlorothiazide .  Currently asymptomatic.  Check labs due to ARB and diuretic use but no anticipated med changes.

## 2024-06-05 NOTE — Patient Instructions (Signed)
 Schedule your complete physical in 6 months We'll notify you of your lab results and make any changes if needed Continue to work on healthy diet and regular exercise- you can do it! Call with any questions or concerns Stay Safe!  Stay Healthy! Have a great summer!!! 

## 2024-06-05 NOTE — Assessment & Plan Note (Signed)
Chronic problem.  Tolerating Lipitor w/o difficulty.  Check labs.  Adjust meds prn  

## 2024-06-05 NOTE — Assessment & Plan Note (Signed)
 Ongoing issue.  Currently diet controlled.  Down 5 lbs since last visit.  UTD on eye exam, foot exam.  Will get A1C and microalbumin today.

## 2024-06-06 ENCOUNTER — Ambulatory Visit: Payer: Self-pay | Admitting: Family Medicine

## 2024-07-10 ENCOUNTER — Other Ambulatory Visit: Payer: Self-pay | Admitting: Family Medicine

## 2024-07-10 DIAGNOSIS — E1169 Type 2 diabetes mellitus with other specified complication: Secondary | ICD-10-CM

## 2024-08-25 ENCOUNTER — Other Ambulatory Visit: Payer: Self-pay | Admitting: Family Medicine

## 2024-08-25 DIAGNOSIS — I10 Essential (primary) hypertension: Secondary | ICD-10-CM

## 2024-10-30 ENCOUNTER — Other Ambulatory Visit: Payer: Self-pay | Admitting: Family Medicine

## 2024-11-01 LAB — OPHTHALMOLOGY REPORT-SCANNED

## 2024-11-20 ENCOUNTER — Other Ambulatory Visit: Payer: Self-pay | Admitting: Family Medicine

## 2024-11-20 DIAGNOSIS — E038 Other specified hypothyroidism: Secondary | ICD-10-CM

## 2024-11-28 ENCOUNTER — Ambulatory Visit: Admitting: *Deleted

## 2024-11-28 VITALS — Ht 62.0 in | Wt 193.0 lb

## 2024-11-28 DIAGNOSIS — Z0000 Encounter for general adult medical examination without abnormal findings: Secondary | ICD-10-CM

## 2024-11-28 NOTE — Progress Notes (Signed)
 Chief Complaint  Patient presents with   Medicare Wellness     Subjective:   Priscilla Thompson is a 73 y.o. female who presents for a Medicare Annual Wellness Visit.   No voiced or noted concerns at this time Patient advised to keep follow-up appointment with PCP (12-12-2024 7:40)     Visit info / Clinical Intake: Medicare Wellness Visit Type:: Subsequent Annual Wellness Visit Persons participating in visit and providing information:: patient Medicare Wellness Visit Mode:: Telephone If telephone:: video declined Since this visit was completed virtually, some vitals may be partially provided or unavailable. Missing vitals are due to the limitations of the virtual format.: Unable to obtain vitals - no equipment If Telephone or Video please confirm:: I connected with patient using audio/video enable telemedicine. I verified patient identity with two identifiers, discussed telehealth limitations, and patient agreed to proceed. Patient Location:: home Provider Location:: home Interpreter Needed?: No Pre-visit prep was completed: no AWV questionnaire completed by patient prior to visit?: yes Date:: 11/24/24 Living arrangements:: with family/others Patient's Overall Health Status Rating: good Typical amount of pain: some Does pain affect daily life?: no  Dietary Habits and Nutritional Risks How many meals a day?: 3 Eats fruit and vegetables daily?: yes Most meals are obtained by: preparing own meals; eating out In the last 2 weeks, have you had any of the following?: none Diabetic:: no  Functional Status Activities of Daily Living (to include ambulation/medication): Independent Ambulation: Independent Medication Administration: Independent Home Management (perform basic housework or laundry): Independent Manage your own finances?: yes Primary transportation is: driving Concerns about vision?: no *vision screening is required for WTM* Concerns about hearing?: no  Fall  Screening Falls in the past year?: 0 Number of falls in past year: 0 Was there an injury with Fall?: 0 Fall Risk Category Calculator: 0 Patient Fall Risk Level: Low Fall Risk  Fall Risk Patient at Risk for Falls Due to: No Fall Risks Fall risk Follow up: Falls evaluation completed; Education provided; Falls prevention discussed  Home and Transportation Safety: All rugs have non-skid backing?: N/A, no rugs All stairs or steps have railings?: N/A, no stairs Grab bars in the bathtub or shower?: (!) no Have non-skid surface in bathtub or shower?: yes Good home lighting?: yes Regular seat belt use?: yes Hospital stays in the last year:: no  Cognitive Assessment Difficulty concentrating, remembering, or making decisions? : no Will 6CIT or Mini Cog be Completed: yes What year is it?: 0 points What month is it?: 0 points Give patient an address phrase to remember (5 components): Its very sunny outside today in December About what time is it?: 0 points Count backwards from 20 to 1: 0 points Say the months of the year in reverse: 0 points Repeat the address phrase from earlier: 0 points 6 CIT Score: 0 points  Advance Directives (For Healthcare) Does Patient Have a Medical Advance Directive?: No Would patient like information on creating a medical advance directive?: No - Patient declined  Reviewed/Updated  Reviewed/Updated: Reviewed All (Medical, Surgical, Family, Medications, Allergies, Care Teams, Patient Goals); Surgical History; Family History; Medications; Allergies; Care Teams; Patient Goals; Medical History    Allergies (verified) Pollen extract   Current Medications (verified) Outpatient Encounter Medications as of 11/28/2024  Medication Sig   acetaminophen  (TYLENOL ) 650 MG CR tablet Take 1,300 mg by mouth every 8 (eight) hours as needed for pain (for arthritis).   amLODipine  (NORVASC ) 5 MG tablet TAKE 1 TABLET (5 MG TOTAL) BY MOUTH DAILY.  aspirin 81 MG tablet Take 81  mg by mouth daily.   atorvastatin  (LIPITOR) 20 MG tablet TAKE 1 TABLET BY MOUTH EVERY DAY   Calcium  Carb-Cholecalciferol (CALCIUM -VITAMIN D ) 500-400 MG-UNIT TABS Take 1 tablet by mouth daily.   Cholecalciferol (VITAMIN D3) 1000 UNITS CAPS Take 1 capsule by mouth daily.   cyclobenzaprine  (FLEXERIL ) 5 MG tablet 1 tab po q hs as needed trapezius pain or muscle spasma.   dorzolamide (TRUSOPT) 2 % ophthalmic solution 1 drop 2 (two) times daily.   dorzolamide-timolol (COSOPT) 22.3-6.8 MG/ML ophthalmic solution Place 1 drop into both eyes 2 (two) times daily.    fish oil-omega-3 fatty acids 1000 MG capsule Take 1,000 mg by mouth daily.    levothyroxine  (SYNTHROID ) 50 MCG tablet TAKE 1 TABLET BY MOUTH EVERY DAY   lisinopril -hydrochlorothiazide  (ZESTORETIC ) 20-25 MG tablet TAKE 1 TABLET BY MOUTH EVERY DAY   loratadine (CLARITIN) 10 MG tablet Take 10 mg by mouth daily.   Multiple Vitamins-Minerals (MULTI FOR HER 50+ PO) Take 2 chew by mouth daily   omeprazole (PRILOSEC) 20 MG capsule Take 20 mg by mouth 3 (three) times a week.    potassium chloride  SA (KLOR-CON  M) 20 MEQ tablet TAKE 1 TABLET BY MOUTH EVERY DAY   No facility-administered encounter medications on file as of 11/28/2024.    History: Past Medical History:  Diagnosis Date   COVID-19    Diabetes mellitus    Hyperlipidemia    Hypertension    Hypothyroid    Osteoarthritis    Thyroid  disease    History reviewed. No pertinent surgical history. Family History  Problem Relation Age of Onset   Diabetes Sister    Diabetes Brother    Cancer Brother        liver, lung   Social History   Occupational History   Not on file  Tobacco Use   Smoking status: Never   Smokeless tobacco: Never  Vaping Use   Vaping status: Never Used  Substance and Sexual Activity   Alcohol use: Yes    Alcohol/week: 0.0 standard drinks of alcohol    Comment: socially   Drug use: No   Sexual activity: Not Currently   Tobacco Counseling Counseling  given: Not Answered  SDOH Screenings   Food Insecurity: No Food Insecurity (11/28/2024)  Housing: Unknown (11/28/2024)  Transportation Needs: No Transportation Needs (11/28/2024)  Utilities: Not At Risk (11/28/2024)  Alcohol Screen: Low Risk (08/05/2023)  Depression (PHQ2-9): Low Risk (11/28/2024)  Financial Resource Strain: Low Risk (11/24/2024)  Physical Activity: Inactive (11/28/2024)  Social Connections: Moderately Isolated (11/28/2024)  Stress: No Stress Concern Present (11/28/2024)  Tobacco Use: Low Risk (11/28/2024)  Health Literacy: Adequate Health Literacy (11/28/2024)   See flowsheets for full screening details  Depression Screen PHQ 2 & 9 Depression Scale- Over the past 2 weeks, how often have you been bothered by any of the following problems? Little interest or pleasure in doing things: 0 Feeling down, depressed, or hopeless (PHQ Adolescent also includes...irritable): 0 PHQ-2 Total Score: 0 Trouble falling or staying asleep, or sleeping too much: 0 Feeling tired or having little energy: 0 Poor appetite or overeating (PHQ Adolescent also includes...weight loss): 0 Feeling bad about yourself - or that you are a failure or have let yourself or your family down: 0 Trouble concentrating on things, such as reading the newspaper or watching television (PHQ Adolescent also includes...like school work): 0 Moving or speaking so slowly that other people could have noticed. Or the opposite - being so fidgety  or restless that you have been moving around a lot more than usual: 0 Thoughts that you would be better off dead, or of hurting yourself in some way: 0 PHQ-9 Total Score: 0 If you checked off any problems, how difficult have these problems made it for you to do your work, take care of things at home, or get along with other people?: Not difficult at all     Goals Addressed             This Visit's Progress    Increase physical activity   Not on track    Increase  physical activity       Loose weight     Patient Stated   Not on track    Would like to eat healthier & walk more             Objective:    Today's Vitals   11/28/24 0852  Weight: 193 lb (87.5 kg)  Height: 5' 2 (1.575 m)   Body mass index is 35.3 kg/m.  Hearing/Vision screen Hearing Screening - Comments:: No trouble hearing Vision Screening - Comments:: Up to date groat Immunizations and Health Maintenance Health Maintenance  Topic Date Due   DTaP/Tdap/Td (2 - Td or Tdap) 04/15/2023   Influenza Vaccine  07/14/2024   COVID-19 Vaccine (5 - 2025-26 season) 08/14/2024   OPHTHALMOLOGY EXAM  10/13/2024   Mammogram  10/24/2024   FOOT EXAM  11/30/2024   HEMOGLOBIN A1C  12/05/2024   Bone Density Scan  04/04/2025   Diabetic kidney evaluation - eGFR measurement  06/05/2025   Diabetic kidney evaluation - Urine ACR  06/05/2025   Medicare Annual Wellness (AWV)  11/28/2025   Colonoscopy  07/11/2028   Pneumococcal Vaccine: 50+ Years  Completed   Hepatitis C Screening  Completed   Meningococcal B Vaccine  Aged Out   Zoster Vaccines- Shingrix  Discontinued        Assessment/Plan:  This is a routine wellness examination for Algood.  Patient Care Team: Mahlon Comer BRAVO, MD as PCP - General Mat Browning, MD as Consulting Physician (Obstetrics and Gynecology) Octavia Charleston, MD as Consulting Physician (Ophthalmology) Kenn Dunnings, OD (Optometry) Kristie Lamprey, MD as Consulting Physician (Gastroenterology) Nicholaus Sherlean CROME, [facility] (Inactive) (Pharmacist)  I have personally reviewed and noted the following in the patients chart:   Medical and social history Use of alcohol, tobacco or illicit drugs  Current medications and supplements including opioid prescriptions. Functional ability and status Nutritional status Physical activity Advanced directives List of other physicians Hospitalizations, surgeries, and ER visits in previous 12 months Vitals Screenings to  include cognitive, depression, and falls Referrals and appointments  No orders of the defined types were placed in this encounter.  In addition, I have reviewed and discussed with patient certain preventive protocols, quality metrics, and best practice recommendations. A written personalized care plan for preventive services as well as general preventive health recommendations were provided to patient.   Mliss Graff, LPN   [id number]   Return in 1 year (on 11/28/2025).  After Visit Summary: (MyChart) Due to this being a telephonic visit, the after visit summary with patients personalized plan was offered to patient via MyChart   Nurse Notes:

## 2024-11-28 NOTE — Patient Instructions (Addendum)
 Priscilla Thompson,  Thank you for taking the time for your Medicare Wellness Visit. I appreciate your continued commitment to your health goals. Please review the care plan we discussed, and feel free to reach out if I can assist you further.  Please note that Annual Wellness Visits do not include a physical exam. Some assessments may be limited, especially if the visit was conducted virtually. If needed, we may recommend an in-person follow-up with your provider.  Ongoing Care Seeing your primary care provider every 3 to 6 months helps us  monitor your health and provide consistent, personalized care.   Referrals If a referral was made during today's visit and you haven't received any updates within two weeks, please contact the referred provider directly to check on the status.  Recommended Screenings:  Health Maintenance  Topic Date Due   DTaP/Tdap/Td vaccine (2 - Td or Tdap) 04/15/2023   Flu Shot  07/14/2024   COVID-19 Vaccine (5 - 2025-26 season) 08/14/2024   Eye exam for diabetics  10/13/2024   Breast Cancer Screening  10/24/2024   Complete foot exam   11/30/2024   Hemoglobin A1C  12/05/2024   Osteoporosis screening with Bone Density Scan  04/04/2025   Yearly kidney function blood test for diabetes  06/05/2025   Yearly kidney health urinalysis for diabetes  06/05/2025   Medicare Annual Wellness Visit  11/28/2025   Colon Cancer Screening  07/11/2028   Pneumococcal Vaccine for age over 67  Completed   Hepatitis C Screening  Completed   Meningitis B Vaccine  Aged Out   Zoster (Shingles) Vaccine  Discontinued       11/28/2024    8:53 AM  Advanced Directives  Does Patient Have a Medical Advance Directive? No  Would patient like information on creating a medical advance directive? No - Patient declined    Vision: Annual vision screenings are recommended for early detection of glaucoma, cataracts, and diabetic retinopathy. These exams can also reveal signs of chronic conditions  such as diabetes and high blood pressure.  Dental: Annual dental screenings help detect early signs of oral cancer, gum disease, and other conditions linked to overall health, including heart disease and diabetes.  Please see the attached documents for additional preventive care recommendations.   Priscilla Thompson , Thank you for taking time to come for your Medicare Wellness Visit. I appreciate your ongoing commitment to your health goals. Please review the following plan we discussed and let me know if I can assist you in the future.   Screening recommendations/referrals: Colonoscopy:  Mammogram:  Bone Density:  Recommended yearly ophthalmology/optometry visit for glaucoma screening and checkup Recommended yearly dental visit for hygiene and checkup  Vaccinations: Influenza vaccine:  Pneumococcal vaccine:  Tdap vaccine:  Shingles vaccine:       Preventive Care , Female Preventive care refers to lifestyle choices and visits with your health care provider that can promote health and wellness. What does preventive care include? A yearly physical exam. This is also called an annual well check. Dental exams once or twice a year. Routine eye exams. Ask your health care provider how often you should have your eyes checked. Personal lifestyle choices, including: Daily care of your teeth and gums. Regular physical activity. Eating a healthy diet. Avoiding tobacco and drug use. Limiting alcohol use. Practicing safe sex. Taking low-dose aspirin every day. Taking vitamin and mineral supplements as recommended by your health care provider. What happens during an annual well check? The services and screenings done by your  health care provider during your annual well check will depend on your age, overall health, lifestyle risk factors, and family history of disease. Counseling  Your health care provider may ask you questions about your: Alcohol use. Tobacco use. Drug use. Emotional  well-being. Home and relationship well-being. Sexual activity. Eating habits. History of falls. Memory and ability to understand (cognition). Work and work astronomer. Reproductive health. Screening  You may have the following tests or measurements: Height, weight, and BMI. Blood pressure. Lipid and cholesterol levels. These may be checked every 5 years, or more frequently if you are over 23 years old. Skin check. Lung cancer screening. You may have this screening every year starting at age 62 if you have a 30-pack-year history of smoking and currently smoke or have quit within the past 15 years. Fecal occult blood test (FOBT) of the stool. You may have this test every year starting at age 30. Flexible sigmoidoscopy or colonoscopy. You may have a sigmoidoscopy every 5 years or a colonoscopy every 10 years starting at age 59. Hepatitis C blood test. Hepatitis B blood test. Sexually transmitted disease (STD) testing. Diabetes screening. This is done by checking your blood sugar (glucose) after you have not eaten for a while (fasting). You may have this done every 1-3 years. Bone density scan. This is done to screen for osteoporosis. You may have this done starting at age 38. Mammogram. This may be done every 1-2 years. Talk to your health care provider about how often you should have regular mammograms. Talk with your health care provider about your test results, treatment options, and if necessary, the need for more tests. Vaccines  Your health care provider may recommend certain vaccines, such as: Influenza vaccine. This is recommended every year. Tetanus, diphtheria, and acellular pertussis (Tdap, Td) vaccine. You may need a Td booster every 10 years. Zoster vaccine. You may need this after age 39. Pneumococcal 13-valent conjugate (PCV13) vaccine. One dose is recommended after age 32. Pneumococcal polysaccharide (PPSV23) vaccine. One dose is recommended after age 64. Talk to your  health care provider about which screenings and vaccines you need and how often you need them. This information is not intended to replace advice given to you by your health care provider. Make sure you discuss any questions you have with your health care provider. Document Released: 12/27/2015 Document Revised: 08/19/2016 Document Reviewed: 10/01/2015 Elsevier Interactive Patient Education  2017 [employer].  Fall Prevention in the Home Falls can cause injuries. They can happen to people of all ages. There are many things you can do to make your home safe and to help prevent falls. What can I do on the outside of my home? Regularly fix the edges of walkways and driveways and fix any cracks. Remove anything that might make you trip as you walk through a door, such as a raised step or threshold. Trim any bushes or trees on the path to your home. Use bright outdoor lighting. Clear any walking paths of anything that might make someone trip, such as rocks or tools. Regularly check to see if handrails are loose or broken. Make sure that both sides of any steps have handrails. Any raised decks and porches should have guardrails on the edges. Have any leaves, snow, or ice cleared regularly. Use sand or salt on walking paths during winter. Clean up any spills in your garage right away. This includes oil or grease spills. What can I do in the bathroom? Use night lights. Install grab bars  by the toilet and in the tub and shower. Do not use towel bars as grab bars. Use non-skid mats or decals in the tub or shower. If you need to sit down in the shower, use a plastic, non-slip stool. Keep the floor dry. Clean up any water that spills on the floor as soon as it happens. Remove soap buildup in the tub or shower regularly. Attach bath mats securely with double-sided non-slip rug tape. Do not have throw rugs and other things on the floor that can make you trip. What can I do in the bedroom? Use night  lights. Make sure that you have a light by your bed that is easy to reach. Do not use any sheets or blankets that are too big for your bed. They should not hang down onto the floor. Have a firm chair that has side arms. You can use this for support while you get dressed. Do not have throw rugs and other things on the floor that can make you trip. What can I do in the kitchen? Clean up any spills right away. Avoid walking on wet floors. Keep items that you use a lot in easy-to-reach places. If you need to reach something above you, use a strong step stool that has a grab bar. Keep electrical cords out of the way. Do not use floor polish or wax that makes floors slippery. If you must use wax, use non-skid floor wax. Do not have throw rugs and other things on the floor that can make you trip. What can I do with my stairs? Do not leave any items on the stairs. Make sure that there are handrails on both sides of the stairs and use them. Fix handrails that are broken or loose. Make sure that handrails are as long as the stairways. Check any carpeting to make sure that it is firmly attached to the stairs. Fix any carpet that is loose or worn. Avoid having throw rugs at the top or bottom of the stairs. If you do have throw rugs, attach them to the floor with carpet tape. Make sure that you have a light switch at the top of the stairs and the bottom of the stairs. If you do not have them, ask someone to add them for you. What else can I do to help prevent falls? Wear shoes that: Do not have high heels. Have rubber bottoms. Are comfortable and fit you well. Are closed at the toe. Do not wear sandals. If you use a stepladder: Make sure that it is fully opened. Do not climb a closed stepladder. Make sure that both sides of the stepladder are locked into place. Ask someone to hold it for you, if possible. Clearly mark and make sure that you can see: Any grab bars or handrails. First and last  steps. Where the edge of each step is. Use tools that help you move around (mobility aids) if they are needed. These include: Canes. Walkers. Scooters. Crutches. Turn on the lights when you go into a dark area. Replace any light bulbs as soon as they burn out. Set up your furniture so you have a clear path. Avoid moving your furniture around. If any of your floors are uneven, fix them. If there are any pets around you, be aware of where they are. Review your medicines with your doctor. Some medicines can make you feel dizzy. This can increase your chance of falling. Ask your doctor what other things that you can do to  help prevent falls. This information is not intended to replace advice given to you by your health care provider. Make sure you discuss any questions you have with your health care provider. Document Released: 09/26/2009 Document Revised: 05/07/2016 Document Reviewed: 01/04/2015 Elsevier Interactive Patient Education  2017 [employer].

## 2024-12-12 ENCOUNTER — Encounter: Payer: Self-pay | Admitting: Family Medicine

## 2024-12-12 ENCOUNTER — Ambulatory Visit: Admitting: Family Medicine

## 2024-12-12 VITALS — BP 138/88 | HR 86 | Temp 98.0°F | Resp 14 | Ht 61.81 in | Wt 195.2 lb

## 2024-12-12 DIAGNOSIS — Z0000 Encounter for general adult medical examination without abnormal findings: Secondary | ICD-10-CM

## 2024-12-12 DIAGNOSIS — Z13220 Encounter for screening for lipoid disorders: Secondary | ICD-10-CM

## 2024-12-12 DIAGNOSIS — E559 Vitamin D deficiency, unspecified: Secondary | ICD-10-CM | POA: Diagnosis not present

## 2024-12-12 DIAGNOSIS — E119 Type 2 diabetes mellitus without complications: Secondary | ICD-10-CM

## 2024-12-12 DIAGNOSIS — Z23 Encounter for immunization: Secondary | ICD-10-CM | POA: Diagnosis not present

## 2024-12-12 LAB — CBC WITH DIFFERENTIAL/PLATELET
Basophils Absolute: 0 K/uL (ref 0.0–0.1)
Basophils Relative: 0.5 % (ref 0.0–3.0)
Eosinophils Absolute: 0.1 K/uL (ref 0.0–0.7)
Eosinophils Relative: 2.1 % (ref 0.0–5.0)
HCT: 38.7 % (ref 36.0–46.0)
Hemoglobin: 12.8 g/dL (ref 12.0–15.0)
Lymphocytes Relative: 35.2 % (ref 12.0–46.0)
Lymphs Abs: 2.1 K/uL (ref 0.7–4.0)
MCHC: 33 g/dL (ref 30.0–36.0)
MCV: 88.2 fl (ref 78.0–100.0)
Monocytes Absolute: 0.5 K/uL (ref 0.1–1.0)
Monocytes Relative: 7.5 % (ref 3.0–12.0)
Neutro Abs: 3.3 K/uL (ref 1.4–7.7)
Neutrophils Relative %: 54.7 % (ref 43.0–77.0)
Platelets: 255 K/uL (ref 150.0–400.0)
RBC: 4.39 Mil/uL (ref 3.87–5.11)
RDW: 13.4 % (ref 11.5–15.5)
WBC: 6 K/uL (ref 4.0–10.5)

## 2024-12-12 LAB — BASIC METABOLIC PANEL WITH GFR
BUN: 14 mg/dL (ref 6–23)
CO2: 31 meq/L (ref 19–32)
Calcium: 9.8 mg/dL (ref 8.4–10.5)
Chloride: 102 meq/L (ref 96–112)
Creatinine, Ser: 0.73 mg/dL (ref 0.40–1.20)
GFR: 81.09 mL/min
Glucose, Bld: 92 mg/dL (ref 70–99)
Potassium: 3.8 meq/L (ref 3.5–5.1)
Sodium: 141 meq/L (ref 135–145)

## 2024-12-12 LAB — LIPID PANEL
Cholesterol: 143 mg/dL (ref 28–200)
HDL: 65.1 mg/dL
LDL Cholesterol: 65 mg/dL (ref 10–99)
NonHDL: 77.89
Total CHOL/HDL Ratio: 2
Triglycerides: 65 mg/dL (ref 10.0–149.0)
VLDL: 13 mg/dL (ref 0.0–40.0)

## 2024-12-12 LAB — HEMOGLOBIN A1C: Hgb A1c MFr Bld: 6.1 % (ref 4.6–6.5)

## 2024-12-12 LAB — VITAMIN D 25 HYDROXY (VIT D DEFICIENCY, FRACTURES): VITD: 74.36 ng/mL (ref 30.00–100.00)

## 2024-12-12 LAB — HEPATIC FUNCTION PANEL
ALT: 17 U/L (ref 3–35)
AST: 15 U/L (ref 5–37)
Albumin: 4.4 g/dL (ref 3.5–5.2)
Alkaline Phosphatase: 54 U/L (ref 39–117)
Bilirubin, Direct: 0.2 mg/dL (ref 0.1–0.3)
Total Bilirubin: 0.6 mg/dL (ref 0.2–1.2)
Total Protein: 7.3 g/dL (ref 6.0–8.3)

## 2024-12-12 LAB — TSH: TSH: 1.03 u[IU]/mL (ref 0.35–5.50)

## 2024-12-12 NOTE — Progress Notes (Signed)
 "  Subjective:    Patient ID: Priscilla Thompson, female    DOB: 11/05/1951, 73 y.o.   MRN: [id number]  HPI CPE- UTD on mammo, colonoscopy, eye exam, foot exam, microalbumin.  UTD on PNA.  Due for flu.  Health Maintenance  Topic Date Due   DTaP/Tdap/Td (2 - Td or Tdap) 04/15/2023   Influenza Vaccine  07/14/2024   COVID-19 Vaccine (5 - 2025-26 season) 08/14/2024   OPHTHALMOLOGY EXAM  10/13/2024   Mammogram  10/24/2024   HEMOGLOBIN A1C  12/05/2024   Bone Density Scan  04/04/2025   Diabetic kidney evaluation - eGFR measurement  06/05/2025   Diabetic kidney evaluation - Urine ACR  06/05/2025   Medicare Annual Wellness (AWV)  11/28/2025   FOOT EXAM  12/12/2025   Colonoscopy  07/11/2028   Pneumococcal Vaccine: 50+ Years  Completed   Hepatitis C Screening  Completed   Meningococcal B Vaccine  Aged Out   Zoster Vaccines- Shingrix  Discontinued    Patient Care Team    Relationship Specialty Notifications Start End  Mahlon Comer BRAVO, MD PCP - General   11/26/10   Mat Browning, MD Consulting Physician Obstetrics and Gynecology  06/14/15   Octavia Charleston, MD Consulting Physician Ophthalmology  06/14/15   Kenn Dunnings, OD  Optometry  06/14/15   Kristie Lamprey, MD Consulting Physician Gastroenterology  06/14/15   Nicholaus Sherlean CROME, [facility] (Inactive)  Pharmacist  10/21/22    Comment: [phone]      Review of Systems Patient reports no vision/ hearing changes, adenopathy,fever, weight change,  persistant/recurrent hoarseness , swallowing issues, chest pain, palpitations, edema, persistant/recurrent cough, hemoptysis, dyspnea (rest/exertional/paroxysmal nocturnal), gastrointestinal bleeding (melena, rectal bleeding), abdominal pain, significant heartburn, bowel changes, GU symptoms (dysuria, hematuria, incontinence), Gyn symptoms (abnormal  bleeding, pain),  syncope, focal weakness, memory loss, numbness & tingling, skin/hair/nail changes, abnormal bruising or bleeding, anxiety, or depression.      Objective:   Physical Exam General Appearance:    Alert, cooperative, no distress, appears stated age, obese  Head:    Normocephalic, without obvious abnormality, atraumatic  Eyes:    PERRL, conjunctiva/corneas clear, EOM's intact both eyes  Ears:    Normal TM's and external ear canals, both ears  Nose:   Nares normal, septum midline, mucosa normal, no drainage    or sinus tenderness  Throat:   Lips, mucosa, and tongue normal; teeth and gums normal  Neck:   Supple, symmetrical, trachea midline, no adenopathy;    Thyroid : no enlargement/tenderness/nodules  Back:     Symmetric, no curvature, ROM normal, no CVA tenderness  Lungs:     Clear to auscultation bilaterally, respirations unlabored  Chest Wall:    No tenderness or deformity   Heart:    Regular rate and rhythm, S1 and S2 normal, no murmur, rub   or gallop  Breast Exam:    Deferred to mammo  Abdomen:     Soft, non-tender, bowel sounds active all four quadrants,    no masses, no organomegaly  Genitalia:    Deferred to GYN  Rectal:    Extremities:   Extremities normal, atraumatic, no cyanosis or edema  Pulses:   2+ and symmetric all extremities  Skin:   Skin color, texture, turgor normal, no rashes or lesions  Lymph nodes:   Cervical, supraclavicular, and axillary nodes normal  Neurologic:   CNII-XII intact, normal strength, sensation and reflexes    throughout          Assessment & Plan:    "

## 2024-12-12 NOTE — Assessment & Plan Note (Signed)
 Ongoing issue.  BMI 35.92 but her other medical issues qualify as morbid obesity.  Encouraged low carb diet and regular exercise.  Will follow.

## 2024-12-12 NOTE — Assessment & Plan Note (Signed)
 Pt's PE WNL w/ exception of BMI.  UTD on mammo, colonoscopy, PNA, eye exam, microalbumin.  Flu shot given today.  Check labs.  Anticipatory guidance provided.

## 2024-12-12 NOTE — Patient Instructions (Signed)
 Follow up in 6 months to recheck sugar, BP, and cholesterol We'll notify you of your lab results and make any changes if needed Continue to work on healthy diet and regular exercise- you can do it! Call with any questions or concerns Stay Safe!  Stay Healthy! Happy New Year!!

## 2024-12-13 ENCOUNTER — Ambulatory Visit: Payer: Self-pay | Admitting: Family Medicine

## 2024-12-13 ENCOUNTER — Other Ambulatory Visit: Payer: Self-pay | Admitting: Family Medicine

## 2024-12-13 DIAGNOSIS — E1169 Type 2 diabetes mellitus with other specified complication: Secondary | ICD-10-CM

## 2024-12-13 NOTE — Assessment & Plan Note (Signed)
 Chronic problem.  Not currently on medication- diet controlled.  UTD on eye exam, foot exam, microalbumin.  Check labs.  Start meds prn

## 2025-06-12 ENCOUNTER — Ambulatory Visit: Admitting: Family Medicine
